# Patient Record
Sex: Male | Born: 1990 | Race: Black or African American | Hispanic: No | Marital: Single | Smoking: Never smoker
Health system: Southern US, Community
[De-identification: ages and names within clinical notes are randomized; demographics above are authoritative.]

## PROBLEM LIST (undated history)

## (undated) DIAGNOSIS — M109 Gout, unspecified: Secondary | ICD-10-CM

## (undated) DIAGNOSIS — T7840XA Allergy, unspecified, initial encounter: Secondary | ICD-10-CM

## (undated) HISTORY — DX: Gout, unspecified: M10.9

## (undated) HISTORY — DX: Allergy, unspecified, initial encounter: T78.40XA

---

## 2011-07-08 ENCOUNTER — Encounter (HOSPITAL_COMMUNITY): Payer: Self-pay | Admitting: Anesthesiology

## 2011-07-08 ENCOUNTER — Emergency Department (HOSPITAL_COMMUNITY): Payer: BC Managed Care – PPO

## 2011-07-08 ENCOUNTER — Inpatient Hospital Stay (HOSPITAL_COMMUNITY)
Admission: EM | Admit: 2011-07-08 | Discharge: 2011-07-11 | DRG: 883 | Disposition: A | Discharge status: Home / Self Care | Payer: BC Managed Care – PPO | Attending: Surgery | Admitting: Surgery

## 2011-07-08 ENCOUNTER — Encounter (HOSPITAL_COMMUNITY)
Admission: EM | Disposition: A | Discharge status: Home / Self Care | Payer: Self-pay | Source: Home / Self Care | Attending: Surgery

## 2011-07-08 ENCOUNTER — Ambulatory Visit (HOSPITAL_COMMUNITY): Payer: BC Managed Care – PPO | Admitting: Anesthesiology

## 2011-07-08 ENCOUNTER — Other Ambulatory Visit: Payer: Self-pay

## 2011-07-08 ENCOUNTER — Encounter (HOSPITAL_COMMUNITY): Payer: Self-pay | Admitting: Emergency Medicine

## 2011-07-08 DIAGNOSIS — K3532 Acute appendicitis with perforation and localized peritonitis, without abscess: Secondary | ICD-10-CM

## 2011-07-08 DIAGNOSIS — K352 Acute appendicitis with generalized peritonitis, without abscess: Principal | ICD-10-CM | POA: Diagnosis present

## 2011-07-08 DIAGNOSIS — E876 Hypokalemia: Secondary | ICD-10-CM | POA: Diagnosis present

## 2011-07-08 DIAGNOSIS — K35209 Acute appendicitis with generalized peritonitis, without abscess, unspecified as to perforation: Principal | ICD-10-CM | POA: Diagnosis present

## 2011-07-08 HISTORY — PX: LAPAROSCOPIC APPENDECTOMY: SHX408

## 2011-07-08 LAB — URINE MICROSCOPIC-ADD ON

## 2011-07-08 LAB — BASIC METABOLIC PANEL
BUN: 9 mg/dL (ref 6–23)
Chloride: 85 mEq/L — ABNORMAL LOW (ref 96–112)
GFR calc Af Amer: >90 mL/min (ref >90)
GFR calc non Af Amer: >90 mL/min (ref >90)
Potassium: 2.8 mEq/L — ABNORMAL LOW (ref 3.5–5.1)
Sodium: 126 mEq/L — ABNORMAL LOW (ref 135–145)

## 2011-07-08 LAB — URINALYSIS, ROUTINE W REFLEX MICROSCOPIC
Bilirubin Urine: NEGATIVE
Leukocytes, UA: NEGATIVE
Nitrite: NEGATIVE
Specific Gravity, Urine: 1.018 (ref 1.005–1.030)
Urobilinogen, UA: 0.2 mg/dL (ref 0.0–1.0)
pH: 6 (ref 5.0–8.0)

## 2011-07-08 LAB — CBC
Hemoglobin: 13.9 g/dL (ref 13.0–17.0)
MCH: 28.4 pg (ref 26.0–34.0)
Platelets: 180 10*3/uL (ref 150–400)
RBC: 4.89 MIL/uL (ref 4.22–5.81)
WBC: 16.2 10*3/uL — ABNORMAL HIGH (ref 4.0–10.5)

## 2011-07-08 LAB — DIFFERENTIAL
Lymphocytes Relative: 8 % — ABNORMAL LOW (ref 12–46)
Lymphs Abs: 1.3 10*3/uL (ref 0.7–4.0)
Monocytes Relative: 10 % (ref 3–12)
Neutro Abs: 13.3 10*3/uL — ABNORMAL HIGH (ref 1.7–7.7)
Neutrophils Relative %: 82 % — ABNORMAL HIGH (ref 43–77)

## 2011-07-08 SURGERY — APPENDECTOMY, LAPAROSCOPIC
Anesthesia: General | Site: Abdomen | Wound class: Dirty or Infected

## 2011-07-08 MED ORDER — ACETAMINOPHEN 10 MG/ML IV SOLN
INTRAVENOUS | Status: DC | PRN
  Administered 2011-07-08: 1000 mg via INTRAVENOUS

## 2011-07-08 MED ORDER — LIDOCAINE HCL (CARDIAC) 20 MG/ML IV SOLN
INTRAVENOUS | Status: DC | PRN
  Administered 2011-07-08: 100 mg via INTRAVENOUS

## 2011-07-08 MED ORDER — FENTANYL CITRATE 0.05 MG/ML IJ SOLN
INTRAMUSCULAR | Status: DC | PRN
  Administered 2011-07-08: 50 ug via INTRAVENOUS
  Administered 2011-07-08 (×2): 100 ug via INTRAVENOUS

## 2011-07-08 MED ORDER — BUPIVACAINE HCL (PF) 0.25 % IJ SOLN
INTRAMUSCULAR | Status: AC
  Filled 2011-07-08: qty 30

## 2011-07-08 MED ORDER — PROPOFOL 10 MG/ML IV BOLUS
INTRAVENOUS | Status: DC | PRN
  Administered 2011-07-08: 200 mg via INTRAVENOUS

## 2011-07-08 MED ORDER — SODIUM CHLORIDE 0.9 % IV SOLN
3.0000 g | Freq: Once | INTRAVENOUS | Status: AC
  Administered 2011-07-08: 3 g via INTRAVENOUS
  Filled 2011-07-08: qty 3

## 2011-07-08 MED ORDER — ONDANSETRON HCL 4 MG/2ML IJ SOLN
INTRAMUSCULAR | Status: DC | PRN
  Administered 2011-07-08: 4 mg via INTRAVENOUS

## 2011-07-08 MED ORDER — ACETAMINOPHEN 325 MG PO TABS
ORAL_TABLET | ORAL | Status: AC
  Filled 2011-07-08: qty 2

## 2011-07-08 MED ORDER — SODIUM CHLORIDE 0.9 % IV SOLN
INTRAVENOUS | Status: DC
  Administered 2011-07-08: 17:00:00 via INTRAVENOUS

## 2011-07-08 MED ORDER — KCL IN DEXTROSE-NACL 20-5-0.45 MEQ/L-%-% IV SOLN
INTRAVENOUS | Status: DC
  Administered 2011-07-08: 200 mL via INTRAVENOUS
  Filled 2011-07-08 (×5): qty 1000

## 2011-07-08 MED ORDER — MORPHINE SULFATE 4 MG/ML IJ SOLN
4.0000 mg | Freq: Once | INTRAMUSCULAR | Status: AC
  Administered 2011-07-08: 4 mg via INTRAVENOUS
  Filled 2011-07-08: qty 1

## 2011-07-08 MED ORDER — SODIUM CHLORIDE 0.9 % IV BOLUS (SEPSIS)
1000.0000 mL | Freq: Once | INTRAVENOUS | Status: AC
  Administered 2011-07-08: 1000 mL via INTRAVENOUS

## 2011-07-08 MED ORDER — SUCCINYLCHOLINE CHLORIDE 20 MG/ML IJ SOLN
INTRAMUSCULAR | Status: DC | PRN
  Administered 2011-07-08: 100 mg via INTRAVENOUS

## 2011-07-08 MED ORDER — LACTATED RINGERS IV SOLN
INTRAVENOUS | Status: DC | PRN
  Administered 2011-07-08 (×2): via INTRAVENOUS

## 2011-07-08 MED ORDER — MORPHINE SULFATE 2 MG/ML IJ SOLN
1.0000 mg | INTRAMUSCULAR | Status: DC | PRN
  Administered 2011-07-08: 2 mg via INTRAVENOUS
  Filled 2011-07-08: qty 1

## 2011-07-08 MED ORDER — ACETAMINOPHEN 325 MG PO TABS
650.0000 mg | ORAL_TABLET | Freq: Once | ORAL | Status: AC
  Administered 2011-07-08: 650 mg via ORAL

## 2011-07-08 MED ORDER — ONDANSETRON HCL 4 MG/2ML IJ SOLN
4.0000 mg | Freq: Once | INTRAMUSCULAR | Status: AC
  Administered 2011-07-08: 4 mg via INTRAVENOUS
  Filled 2011-07-08: qty 2

## 2011-07-08 MED ORDER — ROCURONIUM BROMIDE 100 MG/10ML IV SOLN
INTRAVENOUS | Status: DC | PRN
  Administered 2011-07-08: 5 mg via INTRAVENOUS

## 2011-07-08 MED ORDER — ACETAMINOPHEN 10 MG/ML IV SOLN
INTRAVENOUS | Status: AC
  Filled 2011-07-08: qty 100

## 2011-07-08 MED ORDER — MIDAZOLAM HCL 5 MG/5ML IJ SOLN
INTRAMUSCULAR | Status: DC | PRN
  Administered 2011-07-08: 2 mg via INTRAVENOUS

## 2011-07-08 MED ORDER — IOHEXOL 300 MG/ML  SOLN
100.0000 mL | Freq: Once | INTRAMUSCULAR | Status: AC | PRN
  Administered 2011-07-08: 100 mL via INTRAVENOUS

## 2011-07-08 MED ORDER — LACTATED RINGERS IR SOLN
Status: DC | PRN
  Administered 2011-07-08: 3000 mL

## 2011-07-08 SURGICAL SUPPLY — 39 items
APPLIER CLIP ROT 10 11.4 M/L (STAPLE)
BENZOIN TINCTURE PRP APPL 2/3 (GAUZE/BANDAGES/DRESSINGS) IMPLANT
CANISTER SUCTION 2500CC (MISCELLANEOUS) ×4 IMPLANT
CLIP APPLIE ROT 10 11.4 M/L (STAPLE) IMPLANT
CLOTH BEACON ORANGE TIMEOUT ST (SAFETY) ×2 IMPLANT
CUTTER FLEX LINEAR 45M (STAPLE) ×2 IMPLANT
DECANTER SPIKE VIAL GLASS SM (MISCELLANEOUS) IMPLANT
DERMABOND ADVANCED (GAUZE/BANDAGES/DRESSINGS) ×1
DERMABOND ADVANCED .7 DNX12 (GAUZE/BANDAGES/DRESSINGS) ×1 IMPLANT
DRAPE LAPAROSCOPIC ABDOMINAL (DRAPES) ×2 IMPLANT
DRAPE UTILITY 15X26 (DRAPE) ×2 IMPLANT
ELECT REM PT RETURN 9FT ADLT (ELECTROSURGICAL) ×2
ELECTRODE REM PT RTRN 9FT ADLT (ELECTROSURGICAL) ×1 IMPLANT
ENDOLOOP SUT PDS II  0 18 (SUTURE)
ENDOLOOP SUT PDS II 0 18 (SUTURE) IMPLANT
GLOVE BIOGEL PI IND STRL 7.0 (GLOVE) ×2 IMPLANT
GLOVE BIOGEL PI INDICATOR 7.0 (GLOVE) ×2
GLOVE SURG SIGNA 7.5 PF LTX (GLOVE) ×4 IMPLANT
GOWN STRL NON-REIN LRG LVL3 (GOWN DISPOSABLE) IMPLANT
GOWN STRL REIN XL XLG (GOWN DISPOSABLE) ×4 IMPLANT
KIT BASIN OR (CUSTOM PROCEDURE TRAY) ×2 IMPLANT
NS IRRIG 1000ML POUR BTL (IV SOLUTION) ×8 IMPLANT
PENCIL BUTTON HOLSTER BLD 10FT (ELECTRODE) IMPLANT
POUCH SPECIMEN RETRIEVAL 10MM (ENDOMECHANICALS) ×2 IMPLANT
RELOAD 45 VASCULAR/THIN (ENDOMECHANICALS) IMPLANT
RELOAD STAPLE TA45 3.5 REG BLU (ENDOMECHANICALS) ×2 IMPLANT
SCALPEL HARMONIC ACE (MISCELLANEOUS) ×2 IMPLANT
SET IRRIG TUBING LAPAROSCOPIC (IRRIGATION / IRRIGATOR) ×2 IMPLANT
SOLUTION ANTI FOG 6CC (MISCELLANEOUS) ×2 IMPLANT
STRIP CLOSURE SKIN 1/4X3 (GAUZE/BANDAGES/DRESSINGS) IMPLANT
SUT MON AB 5-0 PS2 18 (SUTURE) ×2 IMPLANT
SUT VICRYL 0 UR6 27IN ABS (SUTURE) ×2 IMPLANT
TOWEL OR 17X26 10 PK STRL BLUE (TOWEL DISPOSABLE) ×2 IMPLANT
TRAY FOLEY CATH 14FRSI W/METER (CATHETERS) ×2 IMPLANT
TRAY LAP CHOLE (CUSTOM PROCEDURE TRAY) ×2 IMPLANT
TROCAR XCEL BLUNT TIP 100MML (ENDOMECHANICALS) ×2 IMPLANT
TROCAR Z-THREAD FIOS 11X100 BL (TROCAR) ×2 IMPLANT
TROCAR Z-THREAD FIOS 5X100MM (TROCAR) ×2 IMPLANT
TUBING INSUFFLATION 10FT LAP (TUBING) ×2 IMPLANT

## 2011-07-08 NOTE — ED Notes (Signed)
Pt c/o lower abd pain x 1 week, locates pain just left of center.  Pt also c/o a few episodes of diarrhea and vomiting.  Last emesis 3 days ago. Pt was seen at Urgent Care and was sent here for r/o appedencitis.

## 2011-07-08 NOTE — ED Provider Notes (Signed)
History     CSN: [id number]  Arrival date & time 07/08/11  1407   First MD Initiated Contact with Patient 07/08/11 1519      Chief Complaint  Patient presents with  . Abdominal Pain    (Consider location/radiation/quality/duration/timing/severity/associated sxs/prior treatment) HPI Jorge Nicholson is a 21 y.o. male who has had abdominal pain, and diarrhea for 5 days. At the beginning of the illness. He had 2 days worth of vomiting. The diarrhea is very loose to watery and brown in color. He's had a fever for several days despite taking Tylenol for it. He saw a primary care doctor at an urgent care and was directed here for further evaluation. He does not have cough, shortness of breath, chest pain, back pain, trouble urinating. He has not had problems similar to this in the past. He has not tried any other medication. He has no sick contacts.     History reviewed. No pertinent past medical history.  History reviewed. No pertinent past surgical history.  History reviewed. No pertinent family history.  History  Substance Use Topics  . Smoking status: Current Everyday Smoker -- 0.5 packs/day  . Smokeless tobacco: Not on file  . Alcohol Use: Yes     occasionally      Review of Systems  All other systems reviewed and are negative.    Allergies  Review of patient's allergies indicates no known allergies.  Home Medications   Current Outpatient Rx  Name Route Sig Dispense Refill  . ACETAMINOPHEN 500 MG PO TABS Oral Take 1,000 mg by mouth every 6 (six) hours as needed. pain      BP 124/71  Pulse 111  Temp(Src) 104.1 F (40.1 C) (Oral)  Resp 20  Ht 5\' 8"  (1.727 m)  Wt 150 lb (68.04 kg)  BMI 22.81 kg/m2  SpO2 97%  Physical Exam  Nursing note and vitals reviewed. Constitutional: He is oriented to person, place, and time. He appears well-developed and well-nourished.  HENT:  Head: Normocephalic and atraumatic.  Right Ear: External ear normal.  Left Ear: External  ear normal.  Eyes: Conjunctivae and EOM are normal. Pupils are equal, round, and reactive to light.  Neck: Normal range of motion and phonation normal. Neck supple.  Cardiovascular: Normal rate, normal heart sounds and intact distal pulses.  Exam reveals no friction rub.   No murmur heard.      Irregular heartbeat  Pulmonary/Chest: Effort normal and breath sounds normal. He exhibits no bony tenderness.  Abdominal: Soft. Normal appearance.       Mild diffuse lower quadrant tenderness with increased tenderness in the right lower quadrant. Bowel sounds are hyperactive.  Musculoskeletal: Normal range of motion.  Neurological: He is alert and oriented to person, place, and time. He has normal strength. No cranial nerve deficit or sensory deficit. He exhibits normal muscle tone. Coordination normal.  Skin: Skin is warm, dry and intact.  Psychiatric: He has a normal mood and affect. His behavior is normal. Judgment and thought content normal.    ED Course  Procedures (including critical care time)  Date: 07/08/2011  Rate: 97   Rhythm: normal sinus rhythm  QRS Axis: normal  Intervals: normal  ST/T Wave abnormalities: normal  Conduction Disutrbances:none  Narrative Interpretation:   Old EKG Reviewed: none available  Emergency department treatment: IV fluids, and Tylenol. CAT scan returned showing appendicitis. I discussed case with Dr. Ezzard Standing at [phone] hours. He wants to have the patient treated with antibiotics and he will see  the patient and proceed with an operative or suture later this evening.  Labs Reviewed  CBC - Abnormal; Notable for the following:    WBC 16.2 (*)    All other components within normal limits  DIFFERENTIAL - Abnormal; Notable for the following:    Neutrophils Relative 82 (*)    Neutro Abs 13.3 (*)    Lymphocytes Relative 8 (*)    Monocytes Absolute 1.5 (*)    All other components within normal limits  BASIC METABOLIC PANEL - Abnormal; Notable for the following:     Sodium 126 (*)    Potassium 2.8 (*)    Chloride 85 (*)    Glucose, Bld 106 (*)    All other components within normal limits  URINALYSIS, ROUTINE W REFLEX MICROSCOPIC - Abnormal; Notable for the following:    Color, Urine AMBER (*) BIOCHEMICALS MAY BE AFFECTED BY COLOR   APPearance CLOUDY (*)    Hgb urine dipstick SMALL (*)    Ketones, ur TRACE (*)    Protein, ur 100 (*)    All other components within normal limits  LIPASE, BLOOD  URINE MICROSCOPIC-ADD ON  URINE CULTURE   Ct Abdomen Pelvis W Contrast  07/08/2011  *RADIOLOGY REPORT*  Clinical Data: 21 year old male with abdominal and pelvic pain.  CT ABDOMEN AND PELVIS WITH CONTRAST  Technique:  Multidetector CT imaging of the abdomen and pelvis was performed following the standard protocol during bolus administration of intravenous contrast.  Contrast: OMNIPAQUE IOHEXOL 300 MG/ML IV SOLN  Comparison: None  Findings: The appendix is thickened with a moderate to large amount of inflammation within the right pelvis compatible with appendicitis.  A 2 cm focal area of low attenuation with two small foci of gas (image 59) may represent a dilated appendiceal tip but more suspicious for appendiceal rupture and phlegmon. Enlarged right mesenteric lymph nodes are identified and likely reactive.  There is no evidence of free fluid, biliary dilatation or abdominal aortic aneurysm.  The liver, spleen, kidneys, pancreas and gallbladder are unremarkable.  No other bowel abnormalities are identified.  There is no evidence of bowel obstruction. The bladder is within normal limits. No acute or suspicious bony abnormalities are identified.  IMPRESSION: Appendicitis with findings likely representing rupture and phlegmon.  These results were called to Dr. Effie Shy on 07/08/2011 at 6:15 p.m.  Original Report Authenticated By: Rosendo Gros, M.D.     1. Acute appendicitis with rupture       MDM  Appendix complicated by rupture. Patient symptomatically stable.  Doubt bacteremia or sepsis        Flint Melter, MD 07/08/11 [phone]

## 2011-07-08 NOTE — H&P (Signed)
Re:   Jorge Nicholson DOB:   1990-11-15 MRN:   [id number]  ASSESSMENT AND PLAN: 1.  Appendicitis, ? Ruptured.  I discussed with the patient the indications and risks of appendiceal surgery.  The primary risks of appendiceal surgery include, but are not limited to, bleeding, infection, bowel surgery, and open surgery.  There is also the risk that the patient may have continued symptoms after surgery.  However, the likelihood of improvement in symptoms and return to the patient's normal status is good. We discussed the typical post-operative recovery course. I tried to answer the patient's questions.  His family was in the room during the discussion.  Chief Complaint  Patient presents with  . Abdominal Pain   REFERRING PHYSICIAN:  Dr. Bernell List, [facility]  HISTORY OF PRESENT ILLNESS: Jorge Nicholson is a 20 y.o. (DOB: [birth year])  AA male whose primary care physician is No primary provider on file. and comes to the [facility] for abdominal pain. He actually first went to Urgent Care on [address] and they sent him to the [facility] for further evaluation.  He developed abdominal pain on Friday, February 15.  He thought he was feeling better.  He was also in [place].  He came back to [facility]. Yesterday, had continued abdominal pain, and that is when he sought medical care.  He has had some nausea and vomiting.  He has not prior GI history.  He has been found to have appendicitis.  I was called by Dr. Bernell List.   History reviewed. No pertinent past medical history.   History reviewed. No pertinent past surgical history.    Current Facility-Administered Medications  Medication Dose Route Frequency Provider Last Rate Last Dose  . 0.9 %  sodium chloride infusion   Intravenous Continuous Flint Melter, MD 125 mL/hr at 07/08/11 1645    . acetaminophen (TYLENOL) tablet 650 mg  650 mg Oral Once Flint Melter, MD   650 mg at 07/08/11 1652  . Ampicillin-Sulbactam (UNASYN) 3 g in sodium chloride 0.9 % 100 mL IVPB  3 g  Intravenous Once Flint Melter, MD   3 g at 07/08/11 1846  . iohexol (OMNIPAQUE) 300 MG/ML solution 100 mL  100 mL Intravenous Once PRN Medication Radiologist, MD   100 mL at 07/08/11 1757  . morphine 4 MG/ML injection 4 mg  4 mg Intravenous Once Flint Melter, MD   4 mg at 07/08/11 1609  . ondansetron (ZOFRAN) injection 4 mg  4 mg Intravenous Once Flint Melter, MD   4 mg at 07/08/11 1609  . sodium chloride 0.9 % bolus 1,000 mL  1,000 mL Intravenous Once Flint Melter, MD   1,000 mL at 07/08/11 1610     No Known Allergies  REVIEW OF SYSTEMS: Skin:  No history of rash.  No history of abnormal moles. Infection:  No history of hepatitis or HIV.  No history of MRSA. Neurologic:  No history of stroke.  No history of seizure.  No history of headaches. Cardiac:  No history of hypertension. No history of heart disease.  No history of prior cardiac catheterization.  No history of seeing a cardiologist. Pulmonary:  Does not smoke cigarettes.  No asthma or bronchitis.  No OSA/CPAP.  Endocrine:  No diabetes. No thyroid disease. Gastrointestinal:  No history of stomach disease.  No history of liver disease.  No history of gall bladder disease.  No history of pancreas disease.  No history of colon disease. Urologic:  No history of kidney stones.  No history of bladder infections. Musculoskeletal:  No history of joint or back disease. Hematologic:  No bleeding disorder.  No history of anemia.  Not anticoagulated. Psycho-social:  The patient is oriented.   The patient has no obvious psychologic or social impairment to understanding our conversation and plan.  SOCIAL and FAMILY HISTORY: His girlfriend, parents, and grand mother are in the room. 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  PHYSICAL EXAM: BP 115/62  Pulse 62  Temp(Src) 102.9 F (39.4 C) (Oral)  Resp 16  Ht 5\' 8"  (1.727 m)  Wt 150 lb (68.04 kg)  BMI 22.81 kg/m2  SpO2 96%  General: WN AA M who is alert and generally  healthy appearing.  HEENT: Normal. Pupils equal. Good dentition. Neck: Supple. No mass.  No thyroid mass.  Carotid pulse okay with no bruit. Lymph Nodes:  No supraclavicular or cervical nodes. Lungs: Clear to auscultation and symmetric breath sounds. Heart:  RRR. No murmur or rub.  Abdomen: Soft. No mass. He has tenderness with mild guarding in the RLQ.  He has had some pain meds.. No hernia. His bowel sounds are decreased.  No abdominal scars. Rectal: Not done. Extremities:  Good strength and ROM  in upper and lower extremities. Neurologic:  Grossly intact to motor and sensory function. Psychiatric: Has normal mood and affect. Behavior is normal.   DATA REVIEWED: WBC - 16,200. CT scan - appendicitis, possible rupture/phlegmon.  Ovidio Kin, MD,  [facility] [address] [place]  [address]   [place], [place]    [address] Phone:  [phone] FAX:  [phone]

## 2011-07-08 NOTE — Anesthesia Preprocedure Evaluation (Signed)
Anesthesia Evaluation  Patient identified by MRN, date of birth, ID band Patient awake    Reviewed: Allergy & Precautions, H&P , NPO status , Patient's Chart, lab work & pertinent test results  Airway Mallampati: II TM Distance: >3 FB Neck ROM: full    Dental No notable dental hx.    Pulmonary neg pulmonary ROS,  clear to auscultation  Pulmonary exam normal       Cardiovascular Exercise Tolerance: Good neg cardio ROS regular Normal    Neuro/Psych Negative Neurological ROS  Negative Psych ROS   GI/Hepatic negative GI ROS, Neg liver ROS,   Endo/Other  Negative Endocrine ROS  Renal/GU negative Renal ROS  Genitourinary negative   Musculoskeletal   Abdominal   Peds  Hematology negative hematology ROS (+)   Anesthesia Other Findings   Reproductive/Obstetrics negative OB ROS                           Anesthesia Physical Anesthesia Plan  ASA: I  Anesthesia Plan: General   Post-op Pain Management:    Induction:   Airway Management Planned:   Additional Equipment:   Intra-op Plan:   Post-operative Plan:   Informed Consent: I have reviewed the patients History and Physical, chart, labs and discussed the procedure including the risks, benefits and alternatives for the proposed anesthesia with the patient or authorized representative who has indicated his/her understanding and acceptance.   Dental Advisory Given  Plan Discussed with: CRNA  Anesthesia Plan Comments:         Anesthesia Quick Evaluation  

## 2011-07-09 ENCOUNTER — Other Ambulatory Visit (INDEPENDENT_AMBULATORY_CARE_PROVIDER_SITE_OTHER): Payer: Self-pay | Admitting: Surgery

## 2011-07-09 LAB — BASIC METABOLIC PANEL
CO2: 31 mEq/L (ref 19–32)
Chloride: 90 mEq/L — ABNORMAL LOW (ref 96–112)
Creatinine, Ser: 0.99 mg/dL (ref 0.50–1.35)
Glucose, Bld: 114 mg/dL — ABNORMAL HIGH (ref 70–99)

## 2011-07-09 LAB — DIFFERENTIAL
Basophils Relative: 0 % (ref 0–1)
Eosinophils Relative: 0 % (ref 0–5)
Lymphs Abs: 1.5 10*3/uL (ref 0.7–4.0)
Monocytes Relative: 5 % (ref 3–12)
Neutro Abs: 13.9 10*3/uL — ABNORMAL HIGH (ref 1.7–7.7)
WBC Morphology: INCREASED

## 2011-07-09 LAB — CBC
HCT: 35.9 % — ABNORMAL LOW (ref 39.0–52.0)
Hemoglobin: 12.6 g/dL — ABNORMAL LOW (ref 13.0–17.0)
MCH: 28.8 pg (ref 26.0–34.0)
MCV: 82 fL (ref 78.0–100.0)
Platelets: 168 10*3/uL (ref 150–400)
RBC: 4.38 MIL/uL (ref 4.22–5.81)
WBC: 16.2 10*3/uL — ABNORMAL HIGH (ref 4.0–10.5)

## 2011-07-09 LAB — SURGICAL PCR SCREEN
MRSA, PCR: NEGATIVE
Staphylococcus aureus: NEGATIVE

## 2011-07-09 MED ORDER — HYDROMORPHONE HCL PF 1 MG/ML IJ SOLN
0.2500 mg | INTRAMUSCULAR | Status: DC | PRN
  Administered 2011-07-09: 0.5 mg via INTRAVENOUS

## 2011-07-09 MED ORDER — MORPHINE SULFATE 2 MG/ML IJ SOLN
1.0000 mg | INTRAMUSCULAR | Status: DC | PRN
  Administered 2011-07-09 (×3): 2 mg via INTRAVENOUS
  Filled 2011-07-09 (×3): qty 1

## 2011-07-09 MED ORDER — BUPIVACAINE HCL (PF) 0.25 % IJ SOLN
INTRAMUSCULAR | Status: DC | PRN
  Administered 2011-07-09: 23 mL

## 2011-07-09 MED ORDER — HYDROCODONE-ACETAMINOPHEN 5-325 MG PO TABS
1.0000 | ORAL_TABLET | ORAL | Status: DC | PRN
  Administered 2011-07-09 – 2011-07-10 (×4): 2 via ORAL
  Administered 2011-07-10: 1 via ORAL
  Administered 2011-07-10 – 2011-07-11 (×3): 2 via ORAL
  Filled 2011-07-09 (×8): qty 2

## 2011-07-09 MED ORDER — KCL IN DEXTROSE-NACL 20-5-0.9 MEQ/L-%-% IV SOLN
INTRAVENOUS | Status: DC
  Administered 2011-07-09 – 2011-07-10 (×3): via INTRAVENOUS
  Filled 2011-07-09 (×6): qty 1000

## 2011-07-09 MED ORDER — NEOSTIGMINE METHYLSULFATE 1 MG/ML IJ SOLN
INTRAMUSCULAR | Status: DC | PRN
  Administered 2011-07-09: 4 mg via INTRAVENOUS

## 2011-07-09 MED ORDER — LACTATED RINGERS IV SOLN: INTRAVENOUS | Status: DC

## 2011-07-09 MED ORDER — PIPERACILLIN-TAZOBACTAM 3.375 G IVPB
3.3750 g | Freq: Three times a day (TID) | INTRAVENOUS | Status: DC
  Administered 2011-07-09 – 2011-07-11 (×8): 3.375 g via INTRAVENOUS
  Filled 2011-07-09 (×12): qty 50

## 2011-07-09 MED ORDER — PROMETHAZINE HCL 25 MG/ML IJ SOLN: 6.2500 mg | INTRAMUSCULAR | Status: DC | PRN

## 2011-07-09 MED ORDER — KCL IN DEXTROSE-NACL 20-5-0.45 MEQ/L-%-% IV SOLN
INTRAVENOUS | Status: DC
  Administered 2011-07-09: 150 mL/h via INTRAVENOUS
  Filled 2011-07-09 (×3): qty 1000

## 2011-07-09 MED ORDER — ONDANSETRON HCL 4 MG/2ML IJ SOLN
4.0000 mg | Freq: Four times a day (QID) | INTRAMUSCULAR | Status: DC | PRN
  Administered 2011-07-10: 4 mg via INTRAVENOUS
  Filled 2011-07-09: qty 2

## 2011-07-09 MED ORDER — GLYCOPYRROLATE 0.2 MG/ML IJ SOLN
INTRAMUSCULAR | Status: DC | PRN
  Administered 2011-07-09: .6 mg via INTRAVENOUS

## 2011-07-09 MED ORDER — HYDROMORPHONE HCL PF 1 MG/ML IJ SOLN
INTRAMUSCULAR | Status: AC
  Filled 2011-07-09: qty 1

## 2011-07-09 MED ORDER — HEPARIN SODIUM (PORCINE) 5000 UNIT/ML IJ SOLN
5000.0000 [IU] | Freq: Three times a day (TID) | INTRAMUSCULAR | Status: DC
  Administered 2011-07-09 – 2011-07-11 (×7): 5000 [IU] via SUBCUTANEOUS
  Filled 2011-07-09 (×10): qty 1

## 2011-07-09 MED ORDER — ONDANSETRON HCL 4 MG PO TABS: 4.0000 mg | ORAL_TABLET | Freq: Four times a day (QID) | ORAL | Status: DC | PRN

## 2011-07-09 MED ORDER — MEPERIDINE HCL 25 MG/ML IJ SOLN: 6.2500 mg | INTRAMUSCULAR | Status: DC | PRN

## 2011-07-09 NOTE — Transfer of Care (Signed)
Immediate Anesthesia Transfer of Care Note  Patient: Jorge Nicholson  Procedure(s) Performed: Procedure(s) (LRB): APPENDECTOMY LAPAROSCOPIC (N/A)  Patient Location: PACU  Anesthesia Type: General  Level of Consciousness: awake, alert  and oriented  Airway & Oxygen Therapy: Patient Spontanous Breathing and Patient connected to face mask oxygen  Post-op Assessment: Report given to PACU RN and Post -op Vital signs reviewed and stable  Post vital signs: Reviewed and stable  Complications: No apparent anesthesia complications

## 2011-07-09 NOTE — Progress Notes (Signed)
Report to minnie rn,  dsg's x 3 cdi

## 2011-07-09 NOTE — Op Note (Signed)
Jorge Nicholson, Jorge Nicholson              ACCOUNT NO.:  [id number]  MEDICAL RECORD NO.:  [id number]  LOCATION:  1539                         FACILITY:  [facility]  PHYSICIAN:  Sandria Bales. Ezzard Standing, M.D.  DATE OF BIRTH:  17-Mar-1991  DATE OF PROCEDURE:  07/09/2011                              OPERATIVE REPORT  PREOPERATIVE DIAGNOSIS:  Appendicitis.  POSTOPERATIVE DIAGNOSIS:  Ruptured hemorrhagic appendicitis.  PROCEDURE:  Laparoscopic appendectomy.  SURGEON:  Sandria Bales. Ezzard Standing, M.D.  ANESTHESIA:  General endotracheal.  ESTIMATED BLOOD LOSS:  Less than 50 cc.  INDICATION FOR PROCEDURE:  Jorge Nicholson is a 21 year old African American male who presented to the [facility] Emergency Room with the signs and symptoms of appendicitis.  He now comes for attempted laparoscopic appendectomy.  The indications and potential complications of the procedure was explained to the patient.  Potential complications include, but are not limited to bleeding, open surgery, another diagnosis, and residual infection.  OPERATIVE NOTE:  The patient had his left arm tucked, his right arm out to the side, a Foley catheter in place.  He will be given Unasyn as an antibiotic, area of room #1 with Dr. Phillips Grout, the supervising anesthesiologist.  A time-out was held with surgical checklist run.  His abdomen was prepped with ChloraPrep and sterilely draped.  I went through an infraumbilical incision with sharp dissection carried down the abdominal cavity.  I placed a 5-mm trocar in the right upper quadrant and a 11-mm trocar in the left lower quadrant.  Abdominal exploration revealed the right and left lobes of liver unremarkable, the gallbladder unremarkable, stomach unremarkable.    His cecum was stretched over the pelvic brim and into the pelvis. His appendix was stuck on the inner side of the right pelvis, was hemorrhagic looking, and focally ruptured.  I could see the small bowel struck on this, which was rolled  medially.  The appendix was both focally ruptured and hemorrhagic.  I was able to free up the tip of the appendix, take the mesentery of the appendix down with a Harmonic Scalpel.  I got to the base of the appendix where I used a blue load of 45-mm Ethicon GIA stapler across the base.  I divided the appendix, placed it in an EndoCatch bag, and delivered through the umbilicus.  I then irrigated the pelvis and area around the appendiceal stump with about 4 L of saline.  I think I got most of the debris out.  I saw no active bleeding.  The trocars were then removed in turn.  The umbilical port closed with 0 Vicryl suture.  The skin of the left lower quadrant also closed with a 0 Vicryl suture.  The skin at each site closed with a 5-0 Monocryl suture and painted with Dermabond.    The patient tolerated the procedure well and was transported to the recovery room in good condition.  Sponge and needle count were correct at the end of the case.   Sandria Bales. Ezzard Standing, M.D., FACS   DHN/MEDQ  D:  07/09/2011  T:  07/09/2011  Job:  [id number]

## 2011-07-09 NOTE — Progress Notes (Signed)
Patient ID: Treyvion Durkee, male   DOB: Sep 24, 1990, 21 y.o.   MRN: [id number] 1 Day Post-Op  Subjective: Pain is controlled with meds. Has been up to bathroom. Denies nausea or other complaints.  Objective: Vital signs in last 24 hours: Temp:  [99 F (37.2 C)-104.1 F (40.1 C)] 100.9 F (38.3 C) (02/23 0600) Pulse Rate:  [62-115] 104  (02/23 0600) Resp:  [16-23] 18  (02/23 0600) BP: (110-134)/(62-78) 124/78 mmHg (02/23 0600) SpO2:  [96 %-100 %] 99 % (02/23 0600) Weight:  [150 lb (68.04 kg)-162 lb 4.8 oz (73.619 kg)] 162 lb 4.8 oz (73.619 kg) (02/22 2013) Last BM Date: 07/08/11  Intake/Output from previous day: 02/22 0701 - 02/23 0700 In: 2560 [P.O.:960; I.V.:1600] Out: 1000 [Urine:900; Blood:100] Intake/Output this shift:    General appearance: alert and no distress GI: abnormal findings:  moderate tenderness in the RLQ Incision/Wound:clean and dry  Lab Results:   Basename 07/08/11 1530  WBC 16.2*  HGB 13.9  HCT 39.6  PLT 180   BMET  Basename 07/08/11 1530  NA 126*  K 2.8*  CL 85*  CO2 27  GLUCOSE 106*  BUN 9  CREATININE 0.97  CALCIUM 9.1     Studies/Results: Ct Abdomen Pelvis W Contrast  07/08/2011  *RADIOLOGY REPORT*  Clinical Data: 21 year old male with abdominal and pelvic pain.  CT ABDOMEN AND PELVIS WITH CONTRAST  Technique:  Multidetector CT imaging of the abdomen and pelvis was performed following the standard protocol during bolus administration of intravenous contrast.  Contrast: OMNIPAQUE IOHEXOL 300 MG/ML IV SOLN  Comparison: None  Findings: The appendix is thickened with a moderate to large amount of inflammation within the right pelvis compatible with appendicitis.  A 2 cm focal area of low attenuation with two small foci of gas (image 59) may represent a dilated appendiceal tip but more suspicious for appendiceal rupture and phlegmon. Enlarged right mesenteric lymph nodes are identified and likely reactive.  There is no evidence of free fluid,  biliary dilatation or abdominal aortic aneurysm.  The liver, spleen, kidneys, pancreas and gallbladder are unremarkable.  No other bowel abnormalities are identified.  There is no evidence of bowel obstruction. The bladder is within normal limits. No acute or suspicious bony abnormalities are identified.  IMPRESSION: Appendicitis with findings likely representing rupture and phlegmon.  These results were called to Dr. Effie Shy on 07/08/2011 at 6:15 p.m.  Original Report Authenticated By: Rosendo Gros, M.D.    Anti-infectives: Anti-infectives     Start     Dose/Rate Route Frequency Ordered Stop   07/09/11 0100  piperacillin-tazobactam (ZOSYN) IVPB 3.375 g       3.375 g 12.5 mL/hr over 240 Minutes Intravenous Every 8 hours 07/09/11 0050     07/08/11 1845   Ampicillin-Sulbactam (UNASYN) 3 g in sodium chloride 0.9 % 100 mL IVPB        3 g 100 mL/hr over 60 Minutes Intravenous  Once 07/08/11 1836 07/08/11 1946          Assessment/Plan: s/p Procedure(s): APPENDECTOMY LAPAROSCOPIC Stable postoperatively. Continue IV antibiotics. Hypokalemia preop. Will recheck electrolytes. Start clear liquid diet.   LOS: 1 day    Latissa Frick T 07/09/2011

## 2011-07-09 NOTE — Brief Op Note (Signed)
07/08/2011 - 07/09/2011  12:26 AM  PATIENT:  Jorge Nicholson, 20 y.o., male, MRN: [id number]  PREOP DIAGNOSIS:  appendicitis  POSTOP DIAGNOSIS:   Ruptured, hemorrhagic appendicitis.  PROCEDURE:   Procedure(s): APPENDECTOMY LAPAROSCOPIC  SURGEON:   Ovidio Kin, M.D.  ASSISTANT:   None  ANESTHESIA:   general  Peggy Williford - CRNA Phillips Grout, MD - Anesthesiologist  General  EBL:  <50  ml  BLOOD ADMINISTERED: none  DRAINS: none   LOCAL MEDICATIONS USED:   20 cc 1/4% marcaine  SPECIMEN:   appendix  COUNTS CORRECT:  YES  INDICATIONS FOR PROCEDURE:  Jorge Nicholson is a 21 y.o. (DOB: 12-04-1990) AA male whose primary care physician is No primary provider on file. and comes for appendectomy   The indications and risks of the surgery were explained to the patient.  The risks include, but are not limited to, infection, bleeding, and nerve injury.  Note dictated to:   #[id number]

## 2011-07-10 LAB — CBC
HCT: 36.1 % — ABNORMAL LOW (ref 39.0–52.0)
MCH: 28.4 pg (ref 26.0–34.0)
MCV: 82.6 fL (ref 78.0–100.0)
RBC: 4.37 MIL/uL (ref 4.22–5.81)
RDW: 13.6 % (ref 11.5–15.5)
WBC: 14.1 10*3/uL — ABNORMAL HIGH (ref 4.0–10.5)

## 2011-07-10 LAB — URINE CULTURE: Culture  Setup Time: 201302230117

## 2011-07-10 LAB — BASIC METABOLIC PANEL
BUN: 6 mg/dL (ref 6–23)
CO2: 32 mEq/L (ref 19–32)
Chloride: 96 mEq/L (ref 96–112)
Creatinine, Ser: 0.82 mg/dL (ref 0.50–1.35)

## 2011-07-10 MED ORDER — POTASSIUM CHLORIDE 20 MEQ PO PACK: 20.0000 meq | PACK | Freq: Two times a day (BID) | ORAL | Status: DC

## 2011-07-10 MED ORDER — POTASSIUM CHLORIDE CRYS ER 20 MEQ PO TBCR
20.0000 meq | EXTENDED_RELEASE_TABLET | Freq: Two times a day (BID) | ORAL | Status: DC
  Administered 2011-07-10 – 2011-07-11 (×3): 20 meq via ORAL
  Filled 2011-07-10 (×4): qty 1

## 2011-07-10 NOTE — Progress Notes (Signed)
Patient ID: Jorge Nicholson, male   DOB: 09-24-1990, 21 y.o.   MRN: [id number] 2 Days Post-Op  Subjective: Feels much better today. Denies pain or other complaints. Tolerating clear liquids without nausea.  Objective: Vital signs in last 24 hours: Temp:  [98.1 F (36.7 C)-99.9 F (37.7 C)] 99.9 F (37.7 C) (02/24 0557) Pulse Rate:  [74-113] 74  (02/24 0557) Resp:  [18-20] 18  (02/24 0557) BP: (116-133)/(71-83) 116/71 mmHg (02/24 0557) SpO2:  [96 %-99 %] 96 % (02/24 0557) Last BM Date: 07/08/11  Intake/Output from previous day: 02/23 0701 - 02/24 0700 In: 2560 [P.O.:360; I.V.:2000; IV Piggyback:200] Out: 2200 [Urine:2200] Intake/Output this shift:    General appearance: alert and no distress GI: normal findings: soft, non-tender Incision/Wound:clean and dry without evidence of infection  Lab Results:   [facility] 07/10/11 0456 07/09/11 0848  WBC 14.1* 16.2*  HGB 12.4* 12.6*  HCT 36.1* 35.9*  PLT 174 168   BMET  Basename 07/10/11 0456 07/09/11 1018  NA 133* 128*  K 3.2* 3.5  CL 96 90*  CO2 32 31  GLUCOSE 126* 114*  BUN 6 9  CREATININE 0.82 0.99  CALCIUM 7.9* 8.1*     Studies/Results: Ct Abdomen Pelvis W Contrast  07/08/2011  *RADIOLOGY REPORT*  Clinical Data: 21 year old male with abdominal and pelvic pain.  CT ABDOMEN AND PELVIS WITH CONTRAST  Technique:  Multidetector CT imaging of the abdomen and pelvis was performed following the standard protocol during bolus administration of intravenous contrast.  Contrast: OMNIPAQUE IOHEXOL 300 MG/ML IV SOLN  Comparison: None  Findings: The appendix is thickened with a moderate to large amount of inflammation within the right pelvis compatible with appendicitis.  A 2 cm focal area of low attenuation with two small foci of gas (image 59) may represent a dilated appendiceal tip but more suspicious for appendiceal rupture and phlegmon. Enlarged right mesenteric lymph nodes are identified and likely reactive.  There is no  evidence of free fluid, biliary dilatation or abdominal aortic aneurysm.  The liver, spleen, kidneys, pancreas and gallbladder are unremarkable.  No other bowel abnormalities are identified.  There is no evidence of bowel obstruction. The bladder is within normal limits. No acute or suspicious bony abnormalities are identified.  IMPRESSION: Appendicitis with findings likely representing rupture and phlegmon.  These results were called to Dr. Effie Shy on 07/08/2011 at 6:15 p.m.  Original Report Authenticated By: Rosendo Gros, M.D.    Anti-infectives: Anti-infectives     Start     Dose/Rate Route Frequency Ordered Stop   07/09/11 0100  piperacillin-tazobactam (ZOSYN) IVPB 3.375 g       3.375 g 12.5 mL/hr over 240 Minutes Intravenous Every 8 hours 07/09/11 0050     07/08/11 1845   Ampicillin-Sulbactam (UNASYN) 3 g in sodium chloride 0.9 % 100 mL IVPB        3 g 100 mL/hr over 60 Minutes Intravenous  Once 07/08/11 1836 07/08/11 1946          Assessment/Plan: s/p Procedure(s): APPENDECTOMY LAPAROSCOPIC Much improved today but still with low grade fever and mildly elevated white blood count. Will continue IV antibiotics today. Probably home soon on oral antibiotics. Advance diet.   LOS: 2 days    Dandra Shambaugh T 07/10/2011

## 2011-07-10 NOTE — Anesthesia Postprocedure Evaluation (Signed)
  Anesthesia Post-op Note  Patient: Jorge Nicholson  Procedure(s) Performed: Procedure(s) (LRB): APPENDECTOMY LAPAROSCOPIC (N/A)  Patient Location: PACU  Anesthesia Type: General  Level of Consciousness: awake and alert   Airway and Oxygen Therapy: Patient Spontanous Breathing  Post-op Pain: mild  Post-op Assessment: Post-op Vital signs reviewed, Patient's Cardiovascular Status Stable, Respiratory Function Stable, Patent Airway and No signs of Nausea or vomiting  Post-op Vital Signs: stable  Complications: No apparent anesthesia complications

## 2011-07-11 LAB — CBC
HCT: 36.7 % — ABNORMAL LOW (ref 39.0–52.0)
MCHC: 34.1 g/dL (ref 30.0–36.0)
MCV: 83.2 fL (ref 78.0–100.0)
RDW: 13.6 % (ref 11.5–15.5)

## 2011-07-11 LAB — BASIC METABOLIC PANEL
BUN: 6 mg/dL (ref 6–23)
Calcium: 8.1 mg/dL — ABNORMAL LOW (ref 8.4–10.5)
Creatinine, Ser: 0.89 mg/dL (ref 0.50–1.35)
GFR calc Af Amer: >90 mL/min (ref >90)
GFR calc non Af Amer: >90 mL/min (ref >90)

## 2011-07-11 MED ORDER — HYDROCODONE-ACETAMINOPHEN 5-325 MG PO TABS: 1.0000 | ORAL_TABLET | ORAL | Status: AC | PRN

## 2011-07-11 MED ORDER — AMOXICILLIN-POT CLAVULANATE 875-125 MG PO TABS: 1.0000 | ORAL_TABLET | Freq: Two times a day (BID) | ORAL | Status: AC

## 2011-07-11 NOTE — Discharge Summary (Signed)
Jorge Shaler M. Patterson Hollenbaugh, MD, FACS General, Bariatric, & Minimally Invasive Surgery [facility]  

## 2011-07-11 NOTE — Discharge Summary (Signed)
Patient ID: Kdyn Vonbehren MRN: [id number] DOB/AGE: 02/09/1991 20 y.o.  Admit date: 07/08/2011 Discharge date: 07/11/2011  Procedures:  Laparoscopic appendectomy  Consults: None  Reason for Admission:  This is a 21 yo male who presented to the [facility] with c/o RLQ abdominal pain.  He was found to have appendicitis.  Admission Diagnoses: 1. Acute appendicitis  Hospital Course: The patient was admitted.  He was taken to the OR where he was found to have hemorrhagic perforated appendicitis.  This was removed.  The patient was left on IV abx therapy for the duration of his admission.  His diet was advanced as tolerated postoperatively.  The patient was stable for discharge on POD# 2.  PE: Abd:  Soft, appropriately tender, +BS, ND, incisions c/d/i with dermabond  Discharge Diagnoses:  1. Acute hemorrhagic perforated appendicitis, s/p lap appy  Discharge Medications: Medication List  As of 07/11/2011 10:26 AM   STOP taking these medications         acetaminophen 500 MG tablet         TAKE these medications         amoxicillin-clavulanate 875-125 MG per tablet   Commonly known as: AUGMENTIN   Take 1 tablet by mouth 2 (two) times daily.      HYDROcodone-acetaminophen 5-325 MG per tablet   Commonly known as: NORCO   Take 1-2 tablets by mouth every 4 (four) hours as needed.            Discharge Instructions: Follow-up Information    Follow up with [facility] on 07/26/2011. (1:50pm, arrive at 1:30pm)    Contact information:   [facility] [address]  [address] [place] [phone]         Signed: Letha Cape 07/11/2011, 10:26 AM

## 2011-07-11 NOTE — Discharge Instructions (Signed)
[facility] ______[facility] °LAPAROSCOPIC SURGERY: POST OP INSTRUCTIONS °Always review your discharge instruction sheet given to you by the facility where your surgery was performed. °IF YOU HAVE DISABILITY OR FAMILY LEAVE FORMS, YOU MUST BRING THEM TO THE OFFICE FOR PROCESSING.   °DO NOT GIVE THEM TO YOUR DOCTOR. ° °1. A prescription for pain medication may be given to you upon discharge.  Take your pain medication as prescribed, if needed.  If narcotic pain medicine is not needed, then you may take acetaminophen (Tylenol) or ibuprofen (Advil) as needed. °2. Take your usually prescribed medications unless otherwise directed. °3. If you need a refill on your pain medication, please contact your pharmacy.  They will contact our office to request authorization. Prescriptions will not be filled after 5pm or on week-ends. °4. You should follow a light diet the first few days after arrival home, such as soup and crackers, etc.  Be sure to include lots of fluids daily. °5. Most patients will experience some swelling and bruising in the area of the incisions.  Ice packs will help.  Swelling and bruising can take several days to resolve.  °6. It is common to experience some constipation if taking pain medication after surgery.  Increasing fluid intake and taking a stool softener (such as Colace) will usually help or prevent this problem from occurring.  A mild laxative (Milk of Magnesia or Miralax) should be taken according to package instructions if there are no bowel movements after 48 hours. °7. Unless discharge instructions indicate otherwise, you may remove your bandages 24-48 hours after surgery, and you may shower at that time.  You may have steri-strips (small skin tapes) in place directly over the incision.  These strips should be left on the skin for 7-10 days.  If your surgeon used skin glue on the incision, you may shower in 24 hours.  The glue will flake off over the next 2-3 weeks.  Any sutures or  staples will be removed at the office during your follow-up visit. °8. ACTIVITIES:  You may resume regular (light) daily activities beginning the next day--such as daily self-care, walking, climbing stairs--gradually increasing activities as tolerated.  You may have sexual intercourse when it is comfortable.  Refrain from any heavy lifting or straining until approved by your doctor. °a. You may drive when you are no longer taking prescription pain medication, you can comfortably wear a seatbelt, and you can safely maneuver your car and apply brakes. °b. RETURN TO WORK:  __________________________________________________________ °9. You should see your doctor in the office for a follow-up appointment approximately 2-3 weeks after your surgery.  Make sure that you call for this appointment within a day or two after you arrive home to insure a convenient appointment time. °10. OTHER INSTRUCTIONS: __________________________________________________________________________________________________________________________ __________________________________________________________________________________________________________________________ °WHEN TO CALL YOUR DOCTOR: °1. Fever over 101.0 °2. Inability to urinate °3. Continued bleeding from incision. °4. Increased pain, redness, or drainage from the incision. °5. Increasing abdominal pain ° °The clinic staff is available to answer your questions during regular business hours.  Please don’t hesitate to call and ask to speak to one of the nurses for clinical concerns.  If you have a medical emergency, go to the nearest emergency room or call 911.  A surgeon from [facility] is always on call at the hospital. °[address], [address], [place], [place]  [address] ? [address] [address], [place], [place]   [address] °[phone] ? [phone] ? FAX [phone] °Web site:   [email or web address] °

## 2011-07-18 ENCOUNTER — Encounter (HOSPITAL_COMMUNITY): Payer: Self-pay | Admitting: Surgery

## 2011-07-18 ENCOUNTER — Telehealth (INDEPENDENT_AMBULATORY_CARE_PROVIDER_SITE_OTHER): Payer: Self-pay | Admitting: General Surgery

## 2011-07-18 NOTE — Telephone Encounter (Signed)
Pt called in asking if he could go back to work at [employer] after having had sx 07/08/11. Pt stated he is ready to go back to work, and received note from hospital at discharge stating he was to stay out of work for 2 to 3 weeks and if he felt ready could go back to work sooner, but could not lift more than 15lbs. I advised him to talk to his supervisor and show the note he received directly from hospital in order to limit how much he does have to lift at work. Confirmed with patient his appointment on 07/26/11 needed to be kept. Also spoke with Maralyn Sago who agreed to keep his appointment as scheduled.

## 2011-07-26 ENCOUNTER — Encounter (INDEPENDENT_AMBULATORY_CARE_PROVIDER_SITE_OTHER): Payer: Self-pay | Admitting: General Surgery

## 2011-07-26 ENCOUNTER — Ambulatory Visit (INDEPENDENT_AMBULATORY_CARE_PROVIDER_SITE_OTHER): Payer: BC Managed Care – PPO | Admitting: General Surgery

## 2011-07-26 VITALS — BP 118/70 | HR 70 | Temp 97.4°F | Resp 18 | Ht 69.0 in | Wt 156.8 lb

## 2011-07-26 DIAGNOSIS — K3580 Unspecified acute appendicitis: Secondary | ICD-10-CM

## 2011-07-26 DIAGNOSIS — Z9889 Other specified postprocedural states: Secondary | ICD-10-CM

## 2011-07-26 NOTE — Patient Instructions (Signed)
Follow up as needed

## 2011-07-26 NOTE — Progress Notes (Signed)
Jorge Nicholson 04/23/1991 [id number] 07/26/2011   History of Present Illness: Jorge Nicholson is a  21 y.o. male who presents today status post lap appy by Dr. Ezzard Standing.  Pathology reveals acute suppurative appendicitis.  The patient is tolerating a regular diet, having normal bowel movements, has good pain control.  He  is back to most normal activities.   Physical Exam: Abd: soft, nontender, active bowel sounds, nondistended.  All incisions are well healed.  Impression: 1.  Acute appendicitis, s/p lap appy  Plan: He  is able to return to normal activities. He  may follow up on a prn basis.

## 2012-10-08 ENCOUNTER — Emergency Department (HOSPITAL_COMMUNITY)
Admission: EM | Admit: 2012-10-08 | Discharge: 2012-10-08 | Disposition: A | Discharge status: Home / Self Care | Payer: BC Managed Care – PPO | Attending: Emergency Medicine | Admitting: Emergency Medicine

## 2012-10-08 ENCOUNTER — Encounter (HOSPITAL_COMMUNITY): Payer: Self-pay | Admitting: Emergency Medicine

## 2012-10-08 ENCOUNTER — Emergency Department (HOSPITAL_COMMUNITY): Payer: BC Managed Care – PPO

## 2012-10-08 DIAGNOSIS — F17200 Nicotine dependence, unspecified, uncomplicated: Secondary | ICD-10-CM | POA: Insufficient documentation

## 2012-10-08 DIAGNOSIS — M25539 Pain in unspecified wrist: Secondary | ICD-10-CM | POA: Insufficient documentation

## 2012-10-08 DIAGNOSIS — M25522 Pain in left elbow: Secondary | ICD-10-CM

## 2012-10-08 MED ORDER — IBUPROFEN 800 MG PO TABS
800.0000 mg | ORAL_TABLET | Freq: Once | ORAL | Status: AC
  Administered 2012-10-08: 800 mg via ORAL
  Filled 2012-10-08: qty 1

## 2012-10-08 NOTE — ED Provider Notes (Signed)
History     CSN: [id number]  Arrival date & time 10/08/12  0815   First MD Initiated Contact with Patient 10/08/12 [phone]      Chief Complaint  Patient presents with  . Elbow Pain    (Consider location/radiation/quality/duration/timing/severity/associated sxs/prior treatment) HPI Complains of left elbow pain onset yesterday. Pain is a left posterior elbow. Worse with movementimproved with remaining still. He reports lifting weights 2    days ago and thinks he strained his elbows or as result of the waist. Pain is nonradiating moderate area no other associated symptoms. No fever no other joint aches History reviewed. No pertinent past medical history.  Past Surgical History  Procedure Laterality Date  . Laparoscopic appendectomy  07/08/2011    Procedure: APPENDECTOMY LAPAROSCOPIC;  Surgeon: Kandis Cocking, MD;  Location: [facility];  Service: General;  Laterality: N/A;    No family history on file.  History  Substance Use Topics  . Smoking status: Current Some Day Smoker -- 0.50 packs/day  . Smokeless tobacco: Never Used  . Alcohol Use: 1.2 oz/week    2 Cans of beer per week     Comment: occasionally      Review of Systems  Constitutional: Negative.   Musculoskeletal: Positive for arthralgias.       Left elbow pain    Allergies  Review of patient's allergies indicates no known allergies.  Home Medications  No current outpatient prescriptions on file.  There were no vitals taken for this visit.  Physical Exam  Nursing note and vitals reviewed. Constitutional: He appears well-developed and well-nourished. No distress.  HENT:  Head: Normocephalic and atraumatic.  Eyes: EOM are normal.  Neck: Neck supple. No tracheal deviation present. No thyromegaly present.  Cardiovascular: Normal rate.   No murmur heard. Pulmonary/Chest: Effort normal.  Abdominal: Soft. He exhibits no distension.  Musculoskeletal: He exhibits no edema and no tenderness.  Left upper extremity no  swelling no redness no warmth elbow has pain with active flexion. Radial pulse 2+ he is able to pronate and supinate without difficulty. Hand shoulder wrist elbow range of motion without pain. All other extremities atraumatic. Nontender. Neurovascular intact  Neurological: He is alert. Coordination normal.  Skin: Skin is warm and dry. No rash noted.  Psychiatric: He has a normal mood and affect.    ED Course  Procedures (including critical care time)  Labs Reviewed - No data to display No results found.   No diagnosis found.  Results for orders placed during the hospital encounter of 07/08/11  URINE CULTURE      Result Value Range   Specimen Description URINE, CLEAN CATCH     Special Requests NONE     Culture  Setup Time [id number]     Colony Count 40,000 COLONIES/ML     Culture       Value: Multiple bacterial morphotypes present, none predominant. Suggest appropriate recollection if clinically indicated.   Report Status 07/10/2011 FINAL    SURGICAL PCR SCREEN      Result Value Range   MRSA, PCR NEGATIVE  NEGATIVE   Staphylococcus aureus NEGATIVE  NEGATIVE  CBC      Result Value Range   WBC 16.2 (*) 4.0 - 10.5 K/uL   RBC 4.89  4.22 - 5.81 MIL/uL   Hemoglobin 13.9  13.0 - 17.0 g/dL   HCT 13.0  86.5 - 78.4 %   MCV 81.0  78.0 - 100.0 fL   MCH 28.4  26.0 - 34.0 pg  MCHC 35.1  30.0 - 36.0 g/dL   RDW 16.1  09.6 - 04.5 %   Platelets 180  150 - 400 K/uL  DIFFERENTIAL      Result Value Range   Neutrophils Relative % 82 (*) 43 - 77 %   Neutro Abs 13.3 (*) 1.7 - 7.7 K/uL   Lymphocytes Relative 8 (*) 12 - 46 %   Lymphs Abs 1.3  0.7 - 4.0 K/uL   Monocytes Relative 10  3 - 12 %   Monocytes Absolute 1.5 (*) 0.1 - 1.0 K/uL   Eosinophils Relative 0  0 - 5 %   Eosinophils Absolute 0.0  0.0 - 0.7 K/uL   Basophils Relative 0  0 - 1 %   Basophils Absolute 0.1  0.0 - 0.1 K/uL   WBC Morphology MILD LEFT SHIFT (1-5% METAS, OCC MYELO, OCC BANDS)     RBC Morphology POLYCHROMASIA PRESENT      Smear Review LARGE PLATELETS PRESENT    BASIC METABOLIC PANEL      Result Value Range   Sodium 126 (*) 135 - 145 mEq/L   Potassium 2.8 (*) 3.5 - 5.1 mEq/L   Chloride 85 (*) 96 - 112 mEq/L   CO2 27  19 - 32 mEq/L   Glucose, Bld 106 (*) 70 - 99 mg/dL   BUN 9  6 - 23 mg/dL   Creatinine, Ser 4.09  0.50 - 1.35 mg/dL   Calcium 9.1  8.4 - 81.1 mg/dL   GFR calc non Af Amer >90  >90 mL/min   GFR calc Af Amer >90  >90 mL/min  URINALYSIS, ROUTINE W REFLEX MICROSCOPIC      Result Value Range   Color, Urine AMBER (*) YELLOW   APPearance CLOUDY (*) CLEAR   Specific Gravity, Urine 1.018  1.005 - 1.030   pH 6.0  5.0 - 8.0   Glucose, UA NEGATIVE  NEGATIVE mg/dL   Hgb urine dipstick SMALL (*) NEGATIVE   Bilirubin Urine NEGATIVE  NEGATIVE   Ketones, ur TRACE (*) NEGATIVE mg/dL   Protein, ur 914 (*) NEGATIVE mg/dL   Urobilinogen, UA 0.2  0.0 - 1.0 mg/dL   Nitrite NEGATIVE  NEGATIVE   Leukocytes, UA NEGATIVE  NEGATIVE  LIPASE, BLOOD      Result Value Range   Lipase 18  11 - 59 U/L  URINE MICROSCOPIC-ADD ON      Result Value Range   WBC, UA 7-10  <3 WBC/hpf   RBC / HPF 3-6  <3 RBC/hpf  CBC      Result Value Range   WBC 16.2 (*) 4.0 - 10.5 K/uL   RBC 4.38  4.22 - 5.81 MIL/uL   Hemoglobin 12.6 (*) 13.0 - 17.0 g/dL   HCT 78.2 (*) 95.6 - 21.3 %   MCV 82.0  78.0 - 100.0 fL   MCH 28.8  26.0 - 34.0 pg   MCHC 35.1  30.0 - 36.0 g/dL   RDW 08.6  57.8 - 46.9 %   Platelets 168  150 - 400 K/uL  DIFFERENTIAL      Result Value Range   Neutrophils Relative % 86 (*) 43 - 77 %   Lymphocytes Relative 9 (*) 12 - 46 %   Monocytes Relative 5  3 - 12 %   Eosinophils Relative 0  0 - 5 %   Basophils Relative 0  0 - 1 %   Neutro Abs 13.9 (*) 1.7 - 7.7 K/uL   Lymphs Abs 1.5  0.7 - 4.0 K/uL   Monocytes Absolute 0.8  0.1 - 1.0 K/uL   Eosinophils Absolute 0.0  0.0 - 0.7 K/uL   Basophils Absolute 0.0  0.0 - 0.1 K/uL   WBC Morphology INCREASED BANDS (>20% BANDS)    BASIC METABOLIC PANEL      Result Value  Range   Sodium 128 (*) 135 - 145 mEq/L   Potassium 3.5  3.5 - 5.1 mEq/L   Chloride 90 (*) 96 - 112 mEq/L   CO2 31  19 - 32 mEq/L   Glucose, Bld 114 (*) 70 - 99 mg/dL   BUN 9  6 - 23 mg/dL   Creatinine, Ser 0.98  0.50 - 1.35 mg/dL   Calcium 8.1 (*) 8.4 - 10.5 mg/dL   GFR calc non Af Amer >90  >90 mL/min   GFR calc Af Amer >90  >90 mL/min  CBC      Result Value Range   WBC 14.1 (*) 4.0 - 10.5 K/uL   RBC 4.37  4.22 - 5.81 MIL/uL   Hemoglobin 12.4 (*) 13.0 - 17.0 g/dL   HCT 11.9 (*) 14.7 - 82.9 %   MCV 82.6  78.0 - 100.0 fL   MCH 28.4  26.0 - 34.0 pg   MCHC 34.3  30.0 - 36.0 g/dL   RDW 56.2  13.0 - 86.5 %   Platelets 174  150 - 400 K/uL  BASIC METABOLIC PANEL      Result Value Range   Sodium 133 (*) 135 - 145 mEq/L   Potassium 3.2 (*) 3.5 - 5.1 mEq/L   Chloride 96  96 - 112 mEq/L   CO2 32  19 - 32 mEq/L   Glucose, Bld 126 (*) 70 - 99 mg/dL   BUN 6  6 - 23 mg/dL   Creatinine, Ser 7.84  0.50 - 1.35 mg/dL   Calcium 7.9 (*) 8.4 - 10.5 mg/dL   GFR calc non Af Amer >90  >90 mL/min   GFR calc Af Amer >90  >90 mL/min  CBC      Result Value Range   WBC 13.3 (*) 4.0 - 10.5 K/uL   RBC 4.41  4.22 - 5.81 MIL/uL   Hemoglobin 12.5 (*) 13.0 - 17.0 g/dL   HCT 69.6 (*) 29.5 - 28.4 %   MCV 83.2  78.0 - 100.0 fL   MCH 28.3  26.0 - 34.0 pg   MCHC 34.1  30.0 - 36.0 g/dL   RDW 13.2  44.0 - 10.2 %   Platelets 207  150 - 400 K/uL  BASIC METABOLIC PANEL      Result Value Range   Sodium 133 (*) 135 - 145 mEq/L   Potassium 3.7  3.5 - 5.1 mEq/L   Chloride 96  96 - 112 mEq/L   CO2 31  19 - 32 mEq/L   Glucose, Bld 96  70 - 99 mg/dL   BUN 6  6 - 23 mg/dL   Creatinine, Ser 7.25  0.50 - 1.35 mg/dL   Calcium 8.1 (*) 8.4 - 10.5 mg/dL   GFR calc non Af Amer >90  >90 mL/min   GFR calc Af Amer >90  >90 mL/min   Dg Elbow Complete Left  10/08/2012   *RADIOLOGY REPORT*  Clinical Data: Pain and swelling  LEFT ELBOW - COMPLETE 3+ VIEW  Comparison: None.  Findings: No detectable joint effusion.  No  evidence of degenerative change.  No fracture or other focal lesion.  IMPRESSION: Negative radiographs  Original Report Authenticated By: Paulina Fusi, M.D.    X-ray viewed by me MDM  No signs of infection Pain likely secondary to overuse Plan ibuprofen, ice, rest orthopedic referral as needed 3 days Diagnosis left elbow pain        Doug Sou, MD 10/08/12 [phone]

## 2012-10-08 NOTE — ED Notes (Signed)
Bed:WA04<BR> Expected date:<BR> Expected time:<BR> Means of arrival:<BR> Comments:<BR>

## 2012-10-08 NOTE — ED Notes (Signed)
Pt states that yesterday his left elbow started hurting when he would bend it.  States that today it hurts all the time.  Denies injury.

## 2012-10-08 NOTE — ED Notes (Signed)
Ice pack given to pt.

## 2013-01-02 ENCOUNTER — Emergency Department (HOSPITAL_COMMUNITY)
Admission: EM | Admit: 2013-01-02 | Discharge: 2013-01-02 | Disposition: A | Discharge status: Home / Self Care | Payer: BC Managed Care – PPO | Attending: Emergency Medicine | Admitting: Emergency Medicine

## 2013-01-02 ENCOUNTER — Emergency Department (HOSPITAL_COMMUNITY): Payer: BC Managed Care – PPO

## 2013-01-02 ENCOUNTER — Encounter (HOSPITAL_COMMUNITY): Payer: Self-pay | Admitting: Emergency Medicine

## 2013-01-02 DIAGNOSIS — M25529 Pain in unspecified elbow: Secondary | ICD-10-CM | POA: Insufficient documentation

## 2013-01-02 DIAGNOSIS — M25521 Pain in right elbow: Secondary | ICD-10-CM

## 2013-01-02 DIAGNOSIS — F17200 Nicotine dependence, unspecified, uncomplicated: Secondary | ICD-10-CM | POA: Insufficient documentation

## 2013-01-02 DIAGNOSIS — IMO0001 Reserved for inherently not codable concepts without codable children: Secondary | ICD-10-CM | POA: Insufficient documentation

## 2013-01-02 DIAGNOSIS — M25429 Effusion, unspecified elbow: Secondary | ICD-10-CM | POA: Insufficient documentation

## 2013-01-02 MED ORDER — HYDROCODONE-ACETAMINOPHEN 5-325 MG PO TABS
1.0000 | ORAL_TABLET | Freq: Once | ORAL | Status: AC
  Administered 2013-01-02: 1 via ORAL
  Filled 2013-01-02: qty 1

## 2013-01-02 MED ORDER — HYDROCODONE-ACETAMINOPHEN 5-325 MG PO TABS: 1.0000 | ORAL_TABLET | ORAL | Status: DC | PRN

## 2013-01-02 NOTE — ED Notes (Signed)
Pt c/o lt arm pain since yesterday.  Denies injury.

## 2013-01-02 NOTE — ED Provider Notes (Signed)
CSN: [id number]     Arrival date & time 01/02/13  1040 History     First MD Initiated Contact with Patient 01/02/13 1135     Chief Complaint  Patient presents with  . Arm Pain   (Consider location/radiation/quality/duration/timing/severity/associated sxs/prior Treatment) HPI Comments: Patient is a 22 year old male presented to the emergency department for constant sharp right elbow pain with radiation to shoulder and down to her wrist that began yesterday afternoon while at work. 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 He denies any injury or falls. Patient states his pain is worsened with flexion and extension of the elbow. He denies any alleviating factors but did not try any over-the-counter medications. Denies any numbness or tingling in the right arm. Patient is right-handed.  Patient is a 22 y.o. male presenting with arm pain.  Arm Pain Associated symptoms include arthralgias, joint swelling and myalgias. Pertinent negatives include no fever.    History reviewed. No pertinent past medical history. Past Surgical History  Procedure Laterality Date  . Laparoscopic appendectomy  07/08/2011    Procedure: APPENDECTOMY LAPAROSCOPIC;  Surgeon: Kandis Cocking, MD;  Location: [facility];  Service: General;  Laterality: N/A;   No family history on file. History  Substance Use Topics  . Smoking status: Current Some Day Smoker -- 0.50 packs/day  . Smokeless tobacco: Never Used  . Alcohol Use: 1.2 oz/week    2 Cans of beer per week     Comment: occasionally    Review of Systems  Constitutional: Negative for fever.  Musculoskeletal: Positive for myalgias, joint swelling and arthralgias.  Skin: Negative.     Allergies  Review of patient's allergies indicates no known allergies.  Home Medications   Current Outpatient Rx  Name  Route  Sig  Dispense  Refill  . HYDROcodone-acetaminophen (NORCO/VICODIN) 5-325 MG per tablet   Oral   Take 1 tablet by mouth every 4  (four) hours as needed for pain.   20 tablet   0    BP 121/70  Pulse 46  Temp(Src) 99.2 F (37.3 C) (Oral)  Resp 20  SpO2 100% Physical Exam  Constitutional: He is oriented to person, place, and time. He appears well-developed and well-nourished. No distress.  HENT:  Head: Normocephalic and atraumatic.  Eyes: Conjunctivae are normal.  Neck: Neck supple.  Musculoskeletal:       Right shoulder: Normal.       Right elbow: He exhibits decreased range of motion and swelling. He exhibits no effusion and no deformity. Tenderness found. Medial epicondyle and lateral epicondyle tenderness noted.       Right upper arm: Normal.       Right forearm: Normal. He exhibits no edema, no deformity and no laceration.       Right hand: Normal.  Neurological: He is alert and oriented to person, place, and time.  Skin: Skin is warm and dry. He is not diaphoretic.  Psychiatric: He has a normal mood and affect.    ED Course   Procedures (including critical care time)  Labs Reviewed - No data to display Dg Elbow Complete Right  01/02/2013   *RADIOLOGY REPORT*  Clinical Data: Posterior elbow pain spreading up and down arm, no known injury  RIGHT ELBOW - COMPLETE 3+ VIEW  Comparison: None  Findings: Bone mineralization normal. Joint spaces preserved. No fracture, dislocation, or bone destruction. No joint effusion.  IMPRESSION: No acute osseous abnormalities.   Original Report Authenticated By: Ulyses Southward,  M.D.   1. Elbow pain, right     MDM  Afebrile, NAD, non-toxic, AAOx4. Neurovascularly intact. Sensation intact. Imaging shows no fracture. Directed pt to ice injury, take acetaminophen or ibuprofen for pain, and to elevate and rest the injury when possible. Provided sling for comfort. Return precautions discussed. Advised f/u w/ orthopedist. Patient is agreeable to plan. Patient is stable at time of discharge    Jeannetta Ellis, PA-C 01/02/13 2000

## 2013-01-03 NOTE — ED Provider Notes (Signed)
Medical screening examination/treatment/procedure(s) were performed by non-physician practitioner and as supervising physician I was immediately available for consultation/collaboration.    Tushar Enns R Marilee Ditommaso, MD 01/03/13 1537 

## 2013-05-01 IMAGING — CT CT ABD-PELV W/ CM
1 of 2 series · 15 of 32 positions shown, 19 images · IV contrast (APPLIED)
Comparison: None

CLINICAL DATA: 20-year-old male with abdominal and pelvic pain.

CT ABDOMEN AND PELVIS WITH CONTRAST
TECHNIQUE: Multidetector CT imaging of the abdomen and pelvis was
performed following the standard protocol during bolus
administration of intravenous contrast.
Contrast: 100mL OMNIPAQUE IOHEXOL 300 MG/ML IV SOLN

[Series 2: abd/pel with · axial · 0.66mm/px · z∈[-882,-472]mm · 15 of 90 slices shown, 19 images]
[im 4/90  soft-tissue]
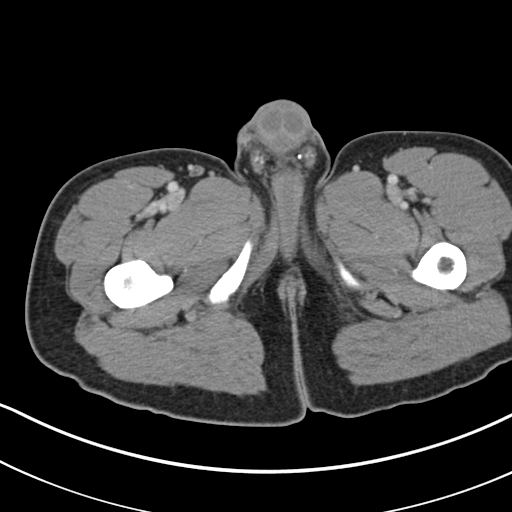
[im 4/90  bone]
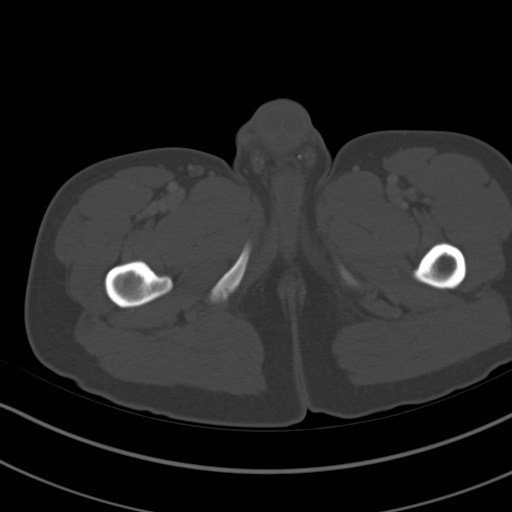
[im 12/90  soft-tissue]
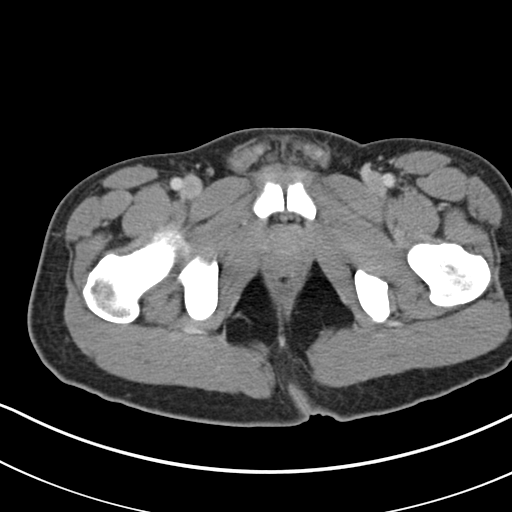
[im 19/90  soft-tissue]
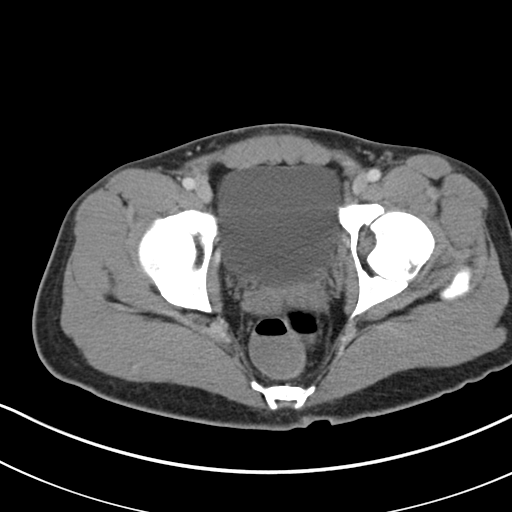
[im 26/90  soft-tissue]
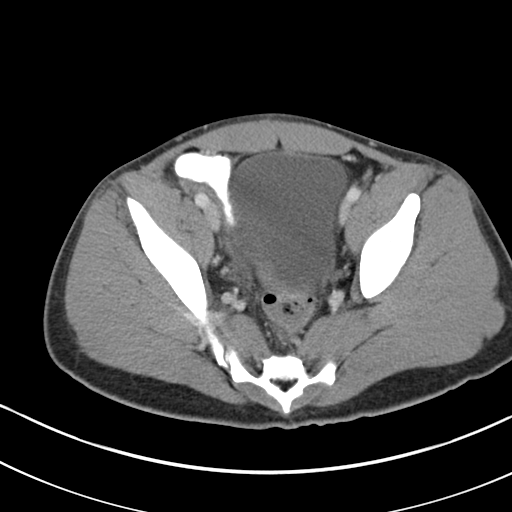
[im 30/90  soft-tissue]
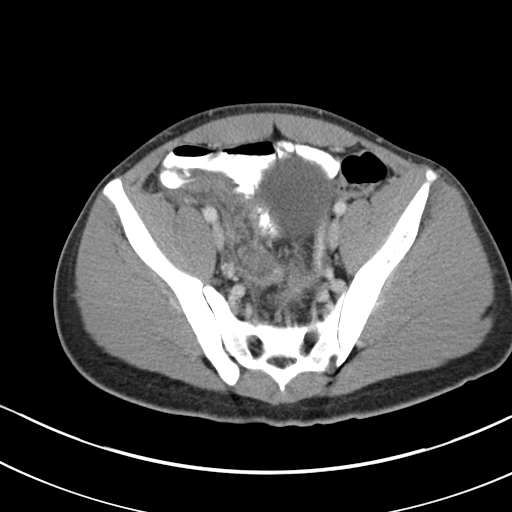
[im 38/90  soft-tissue]
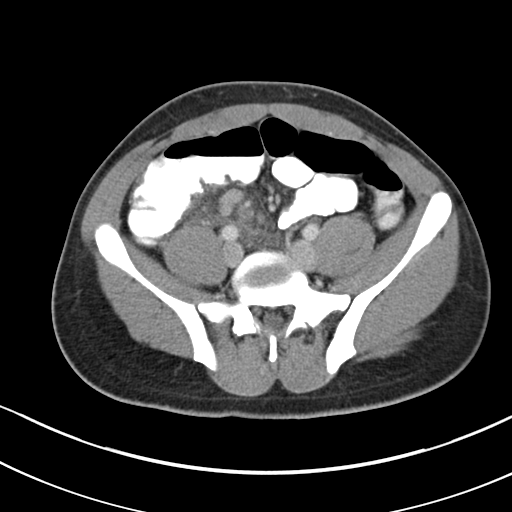
[im 45/90  soft-tissue]
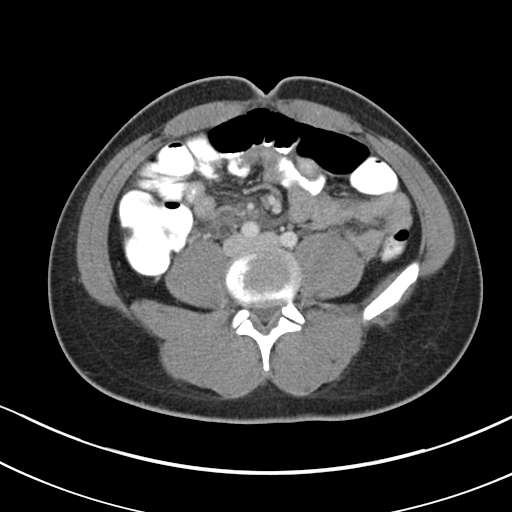
[im 52/90  soft-tissue]
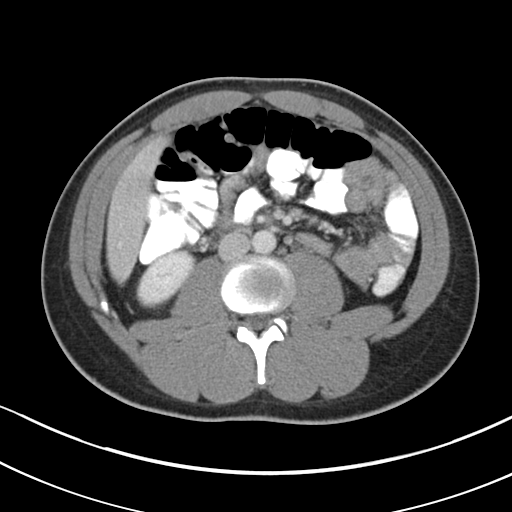
[im 60/90  soft-tissue]
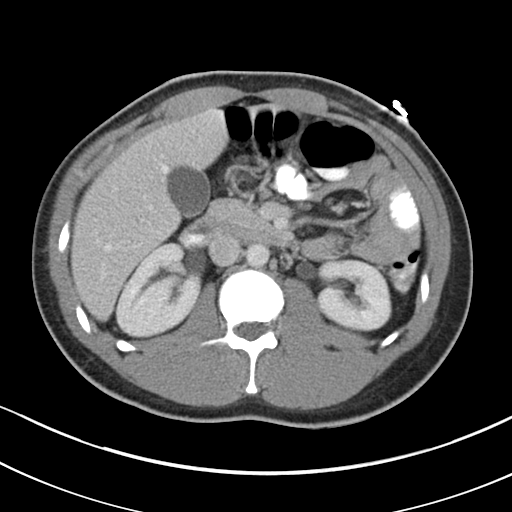
[im 60/90  bone]
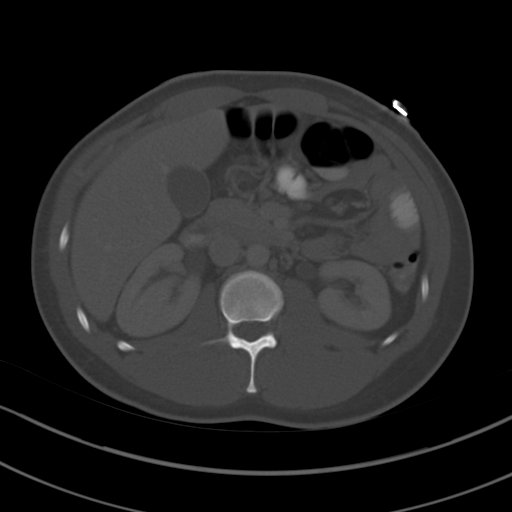
[im 64/90  soft-tissue]
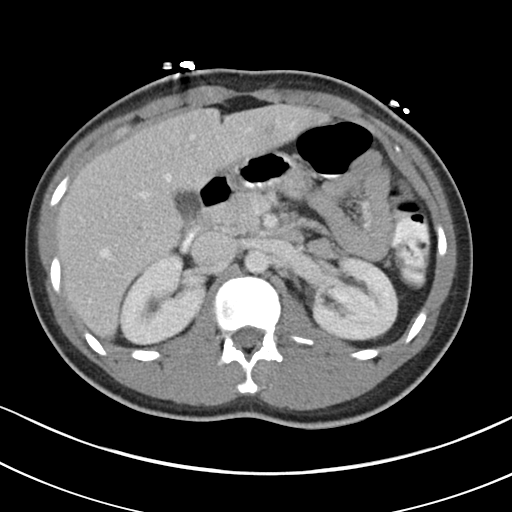
[im 71/90  soft-tissue]
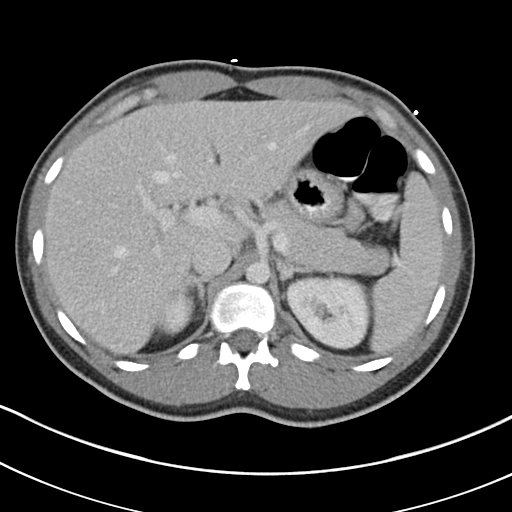
[im 75/90  lung]
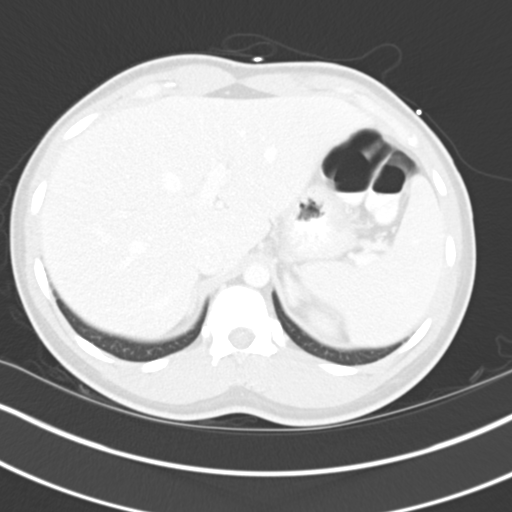
[im 78/90  soft-tissue]
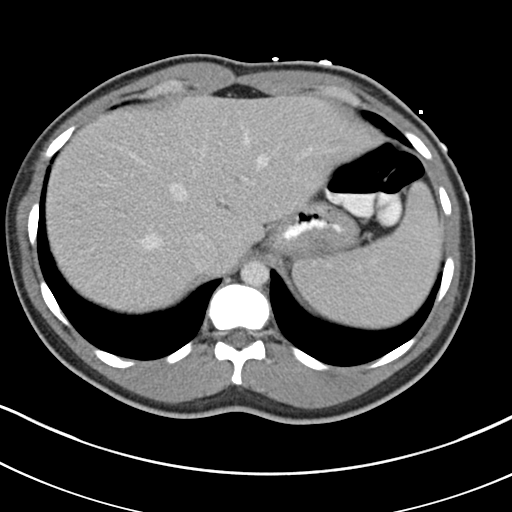
[im 78/90  lung]
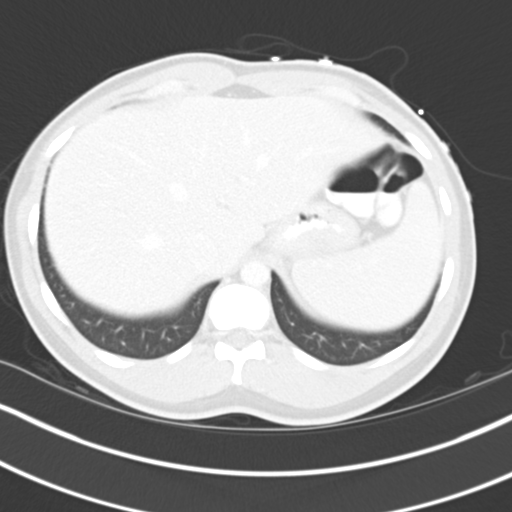
[im 82/90  lung]
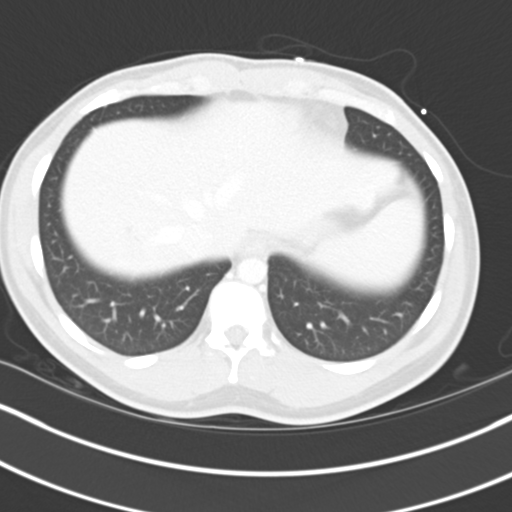
[im 86/90  soft-tissue]
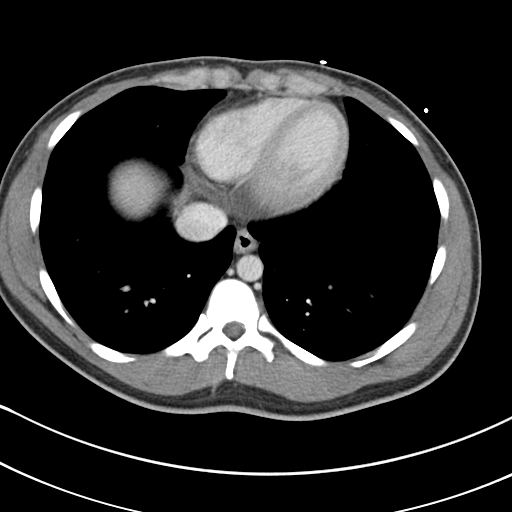
[im 86/90  lung]
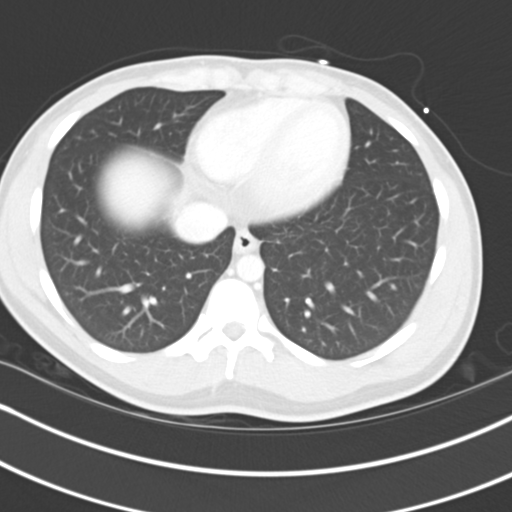

[15 of 32 positions shown; findings below may reference images not displayed]

FINDINGS: The appendix is thickened with a moderate to large amount
of inflammation within the right pelvis compatible with
appendicitis.  A 2 cm focal area of low attenuation with two small
foci of gas (image [DATE] represent a dilated appendiceal tip but
more suspicious for appendiceal rupture and phlegmon. Enlarged
right mesenteric lymph nodes are identified and likely reactive.

There is no evidence of free fluid, biliary dilatation or abdominal
aortic aneurysm.

The liver, spleen, kidneys, pancreas and gallbladder are
unremarkable.

No other bowel abnormalities are identified.  There is no evidence
of bowel obstruction.
The bladder is within normal limits.
No acute or suspicious bony abnormalities are identified.
IMPRESSION: Appendicitis with findings likely representing rupture and
phlegmon.

These results were called to Dr. Ntena on 07/08/2011 at [DATE] p.m..

## 2019-11-12 ENCOUNTER — Encounter: Payer: Self-pay | Admitting: Emergency Medicine

## 2019-11-12 ENCOUNTER — Ambulatory Visit
Admission: EM | Admit: 2019-11-12 | Discharge: 2019-11-12 | Disposition: A | Discharge status: Home / Self Care | Payer: Self-pay | Attending: Emergency Medicine | Admitting: Emergency Medicine

## 2019-11-12 ENCOUNTER — Other Ambulatory Visit: Payer: Self-pay

## 2019-11-12 DIAGNOSIS — L299 Pruritus, unspecified: Secondary | ICD-10-CM

## 2019-11-12 MED ORDER — HYDROXYZINE HCL 25 MG PO TABS
25.0000 mg | ORAL_TABLET | Freq: Four times a day (QID) | ORAL | 0 refills | Status: DC | PRN
Start: 2019-11-12 — End: 2022-12-30

## 2019-11-12 MED ORDER — PREDNISONE 10 MG (21) PO TBPK
ORAL_TABLET | ORAL | 0 refills | Status: DC
Start: 2019-11-12 — End: 2022-12-30

## 2019-11-12 NOTE — Discharge Instructions (Addendum)
discontinue Benadryl, start Claritin or Zyrtec, and if that does not work, Atarax.  6-day prednisone taper.  Continue cool showers.  CeraVe lotion afterwards while skin is still damp.  Here is a list of primary care providers who are taking new patients:  Dr. Elizabeth Sauer [address] [place] [address] [phone]  [facility] [address] [place] [address]  [phone]  [facility] [address] [place], [place] [address] [phone]  [facility] [address] [place]  [phone] [place], [place] [address]  Here are clinics/ other resources who will see you if you do not have insurance. Some have certain criteria that you must meet. Call them and find out what they are:  [facility]: [address], [place], [place] [address] Phone: [phone] Hours: First and Third Saturdays of each Month, 9 a.m. - 1 p.m.  [facility]: [address] [place], [place] [address] Phone: [phone] Hours: Tuesday, 4 p.m. - 8 p.m. Thursday, 1 p.m. - 8 p.m. Wednesday, 9 a.m. - [facility] [address], [place], [place] [address] Phone: [phone] Pharmacy Phone Number: [phone] Dental Phone Number: [phone] [facility] Insurance Help: [phone]  Dental Hours: Monday - Thursday, 8 a.m. - 6 p.m.  [facility] [address], [place], [place] [address] Phone: [phone] Pharmacy Phone Number: [phone] [facility] Insurance Help: [phone]  [facility] [address] [place] [place], [place] [address] Phone: [phone] Pharmacy Phone Number: [phone] [facility] Insurance Help: [phone]  [facility] [address] [place], [place] [address] Phone: [phone] [facility] Insurance Help: [phone]   [facility] [address], [place], [place] [address] Phone: [phone]  Go to www.goodrx.com to  look up your medications. This will give you a list of where you can find your prescriptions at the most affordable prices. Or ask the pharmacist what the cash price is, or if they have any other discount programs available to help make your medication more affordable. This can be less expensive than what you would pay with insurance.

## 2019-11-12 NOTE — ED Triage Notes (Signed)
Patient states he was in a pool 2 weeks ago and has had itching since then. He itches all over his body.

## 2019-11-12 NOTE — ED Provider Notes (Signed)
HPI  SUBJECTIVE:  Jorge Nicholson is a 29 y.o. male who presents with 2 weeks of generalized pruritus.  He states symptoms started after swimming in a pool with his wife.  His wife is asymptomatic.  No dry flaky skin, sunburn, rash, fevers, body aches.  No new lotions, soaps, detergents.  No sensation of being bitten at night.  He states the itching initially was worse at night, but is now itchy all day long.  He states that the itching has not spread since it started.  No pets in the house.  No wheezing, shortness of breath, cough, lip or tongue swelling, hives.  No known exposure to poison ivy, poison oak.  He tried Benadryl with improvement in his symptoms.  He also tried Aveeno starting today and cold showers.  Symptoms are worse after showering.  Past medical history negative for eczema, allergies, asthma.  PMD: None.    History reviewed. No pertinent past medical history.  Past Surgical History:  Procedure Laterality Date  . LAPAROSCOPIC APPENDECTOMY  07/08/2011   Procedure: APPENDECTOMY LAPAROSCOPIC;  Surgeon: Kandis Cocking, MD;  Location: [facility];  Service: General;  Laterality: N/A;    History reviewed. No pertinent family history.  Social History   Tobacco Use  . Smoking status: Current Some Day Smoker    Packs/day: 0.50  . Smokeless tobacco: Never Used  Substance Use Topics  . Alcohol use: Yes    Alcohol/week: 2.0 standard drinks    Types: 2 Cans of beer per week    Comment: occasionally  . Drug use: Yes    Frequency: 2.0 times per week    Types: Marijuana    No current facility-administered medications for this encounter.  Current Outpatient Medications:  .  hydrOXYzine (ATARAX/VISTARIL) 25 MG tablet, Take 1 tablet (25 mg total) by mouth every 6 (six) hours as needed for itching., Disp: 20 tablet, Rfl: 0 .  predniSONE (STERAPRED UNI-PAK 21 TAB) 10 MG (21) TBPK tablet, Dispense one 6 day pack. Take as directed with food., Disp: 21 tablet, Rfl: 0  No Known  Allergies   ROS  As noted in HPI.   Physical Exam  BP 133/77 (BP Location: Right Arm)   Pulse (!) 58   Temp 98.2 F (36.8 C) (Oral)   Resp 18   Ht 5\' 9"  (1.753 m)   Wt 88 kg   SpO2 100%   BMI 28.65 kg/m   Constitutional: Well developed, well nourished, no acute distress Eyes:  EOMI, conjunctiva normal bilaterally HENT: Normocephalic, atraumatic,mucus membranes moist Respiratory: Normal inspiratory effort Cardiovascular: Normal rate GI: nondistended skin: No rash over torso, face, upper extremities, distal lower extremities.  1 noninfected excoriation on forearm.  Skin otherwise intact.  No burrows between fingers, no erythema, no papules, vesicles, crusting,  Musculoskeletal: no deformities Neurologic: Alert & oriented x 3, no focal neuro deficits Psychiatric: Speech and behavior appropriate   ED Course   Medications - No data to display  No orders of the defined types were placed in this encounter.   No results found for this or any previous visit (from the past 24 hour(s)). No results found.  ED Clinical Impression  1. Pruritic dermatitis      ED Assessment/Plan  Patient with generalized pruritus.  No evidence of infection, eczema, tinea, pityriasis, scabies, contact dermatitis, bedbug bites, flea bites.  Unsure as to the etiology of his symptoms.  We will have him discontinue Benadryl, start Claritin or Zyrtec, and if that does not  work, Atarax.  6-day prednisone taper.  Continue cool showers.  CeraVe lotion afterwards.  Primary care referral for ongoing care.  Will refer to Dr. Ebony Cargo, dermatology if these do not resolve his symptoms.  Meds ordered this encounter  Medications  . predniSONE (STERAPRED UNI-PAK 21 TAB) 10 MG (21) TBPK tablet    Sig: Dispense one 6 day pack. Take as directed with food.    Dispense:  21 tablet    Refill:  0  . hydrOXYzine (ATARAX/VISTARIL) 25 MG tablet    Sig: Take 1 tablet (25 mg total) by mouth every 6 (six) hours  as needed for itching.    Dispense:  20 tablet    Refill:  0    *This clinic note was created using Scientist, clinical (histocompatibility and immunogenetics). Therefore, there may be occasional mistakes despite careful proofreading.   ?    Domenick Gong, MD 11/12/19 2048

## 2022-10-23 DIAGNOSIS — M79671 Pain in right foot: Secondary | ICD-10-CM | POA: Diagnosis not present

## 2022-10-23 DIAGNOSIS — S93601A Unspecified sprain of right foot, initial encounter: Secondary | ICD-10-CM | POA: Diagnosis not present

## 2022-12-13 ENCOUNTER — Encounter: Payer: Self-pay | Admitting: Emergency Medicine

## 2022-12-13 ENCOUNTER — Other Ambulatory Visit: Payer: Self-pay

## 2022-12-13 ENCOUNTER — Emergency Department: Payer: BC Managed Care – PPO

## 2022-12-13 ENCOUNTER — Emergency Department
Admission: EM | Admit: 2022-12-13 | Discharge: 2022-12-13 | Disposition: A | Discharge status: Home / Self Care | Payer: BC Managed Care – PPO | Attending: Emergency Medicine | Admitting: Emergency Medicine

## 2022-12-13 DIAGNOSIS — T59811A Toxic effect of smoke, accidental (unintentional), initial encounter: Secondary | ICD-10-CM | POA: Insufficient documentation

## 2022-12-13 DIAGNOSIS — J705 Respiratory conditions due to smoke inhalation: Secondary | ICD-10-CM | POA: Diagnosis not present
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
## 2022-12-13 NOTE — Discharge Instructions (Signed)
Your workup in the Emergency Department today was reassuring.  We did not find any specific abnormalities.  We recommend you drink plenty of fluids, take your regular medications and/or any new ones prescribed today, and follow up with the doctor(s) listed in these documents as recommended.  Return to the Emergency Department if you develop new or worsening symptoms that concern you.  

## 2022-12-23 ENCOUNTER — Ambulatory Visit: Payer: Self-pay | Admitting: Family Medicine

## 2022-12-30 ENCOUNTER — Ambulatory Visit (INDEPENDENT_AMBULATORY_CARE_PROVIDER_SITE_OTHER): Payer: BC Managed Care – PPO | Admitting: Family Medicine

## 2022-12-30 ENCOUNTER — Encounter: Payer: Self-pay | Admitting: Family Medicine

## 2022-12-30 VITALS — BP 138/88 | HR 78 | Ht 69.0 in | Wt 218.0 lb

## 2022-12-30 DIAGNOSIS — Z1159 Encounter for screening for other viral diseases: Secondary | ICD-10-CM

## 2022-12-30 DIAGNOSIS — M109 Gout, unspecified: Secondary | ICD-10-CM | POA: Insufficient documentation

## 2022-12-30 DIAGNOSIS — E559 Vitamin D deficiency, unspecified: Secondary | ICD-10-CM

## 2022-12-30 DIAGNOSIS — R5383 Other fatigue: Secondary | ICD-10-CM | POA: Diagnosis not present

## 2022-12-30 DIAGNOSIS — Z0000 Encounter for general adult medical examination without abnormal findings: Secondary | ICD-10-CM

## 2022-12-30 DIAGNOSIS — M1A0710 Idiopathic chronic gout, right ankle and foot, without tophus (tophi): Secondary | ICD-10-CM

## 2022-12-30 DIAGNOSIS — Z13220 Encounter for screening for lipoid disorders: Secondary | ICD-10-CM

## 2022-12-30 DIAGNOSIS — Z114 Encounter for screening for human immunodeficiency virus [HIV]: Secondary | ICD-10-CM

## 2022-12-30 NOTE — Patient Instructions (Addendum)
-   Obtain fasting labs with orders provided (no food/drink besides water x 8 hours) - Continue healthy lifestyle changes to minimize gout recurrence - Review formation attached and below, make changes where applicable - Return for annual physical once scheduled  Sleep hygiene advice  When possible, maximize regularity in activity and sleep schedule   Regularity in the timing of sleep, food intake, and social activity helps to stabilize the biological clock.Minimizing discrepancies in sleep timing between on-shift and off-shift periods may help you adapt to a fixed-shift schedule and may also help you adapt to each shift type in a rotating-shift schedule (depending onrotation speed and direction).   Create a sleep-friendly bedroom environment   Make sure that your bed is comfortable and that your bedroom is dark, quiet, and cool (around 67F or 18C).Blackout shades may be particularly important to block sunlight during daytime sleep. Creating constantbackground noise in the sleep environment with a fan or humidifier, for example, will eliminate unexpectedsounds that would otherwise wake you up.   Limit exposure to bright light before daytime sleep   Exposure to bright light (eg, sunlight during the morning commute home following a night shift) can bealerting and may also set your biological clock to a time that interferes with daytime sleep.   Make the last hour before bed a "wind-down" time   Engage in relaxing and pleasant activities, dim or block light in the room, and have a light snack.   Do not use alcohol to help you sleep and do not consume alcohol too close to bedtime   Although alcohol may help you to fall asleep more easily, it disrupts your sleep during the night by causingfrequent awakenings. One drink of alcohol should not be consumed within three hours of bedtime.   Smoking and other drugs will disrupt your sleep   If you smoke, do not smoke too close to bedtime or if you  wake up during the intended sleep period. Mostdrugs of abuse can disrupt sleep.   Avoid caffeinated products within six hours of bedtime   In addition to coffee, these may include tea, chocolate, and many sodas.   Exercise regularly, but avoid activities that raise body temperature close to bedtime   Regular exercise can improve sleep quality, but exercising or having a warm bath too close to bedtime candisrupt your ability to fall asleep. Warm baths should be avoided within 1.5 hours of bedtime.   Avoid consuming more than 8 to 10 ounces of liquids close to bedtime   A full or semi-full bladder can contribute to awakenings. Restrict liquids close to bedtime and empty yourbladder just before going to bed.

## 2022-12-30 NOTE — Assessment & Plan Note (Signed)
Chronic issue, currently well-controlled with lifestyle changes, typical pain localizes to the right great toe, pointing to the first MTP.  Plan: - Will obtain baseline uric acid level as part of lab workup - Continue healthy lifestyle changes to minimize gout recurrence - Review formation attached and make changes where applicable

## 2022-12-30 NOTE — Progress Notes (Signed)
Primary Care / Sports Medicine Office Visit  Patient Information:  Patient ID: CLEVON RUBENDALL, male DOB: [birth year] Age: 32 y.o. MRN: [id number]   AREN SAUPE is a pleasant 32 y.o. male presenting with the following:  Chief Complaint  Patient presents with   Establish Care    Vitals:   12/30/22 1007  BP: 138/88  Pulse: 78  SpO2: 98%   Vitals:   12/30/22 1007  Weight: 218 lb (98.9 kg)  Height: 5\' 9"  (1.753 m)   Body mass index is 32.19 kg/m.     Independent interpretation of notes and tests performed by another provider:   None  Procedures performed:   None  Pertinent History, Exam, Impression, and Recommendations:   Khanh was seen today for establish care.  Chronic idiopathic gout involving toe of right foot without tophus Overview: well controlled with lifestyle modifications (EtOH)  Assessment & Plan: Chronic issue, currently well-controlled with lifestyle changes, typical pain localizes to the right great toe, pointing to the first MTP.  Plan: - Will obtain baseline uric acid level as part of lab workup - Continue healthy lifestyle changes to minimize gout recurrence - Review formation attached and make changes where applicable  Orders: -     Uric acid  Other fatigue Assessment & Plan: Ongoing for several months in the setting of young child.  Does report some chronicity previously well-controlled with regular exercise.  He reports roughly 6 hours of sleep nightly, their young child sleeps in the same bed, has been told that he snores in the past.  No significant daytime somnolence reported.  We reviewed various etiologies, patient has deferred sleep study for now.  Plan: - Risk stratification labs ordered - Sleep hygiene information provided - Await lab results, follow accordingly, for persistent symptomatology, we can consider home sleep study  Orders: -     CBC -     Comprehensive metabolic panel -     TSH -     VITAMIN D 25  Hydroxy (Vit-D Deficiency, Fractures) -     Testosterone,Free and Total  Screening for HIV (human immunodeficiency virus) -     HIV Antibody (routine testing w rflx)  Need for hepatitis C screening test -     Hepatitis C antibody  Screening for lipoid disorders -     Apo A1 + B + Ratio -     Comprehensive metabolic panel -     Lipid panel  Annual physical exam -     Apo A1 + B + Ratio -     CBC -     Comprehensive metabolic panel -     Hepatitis C antibody -     HIV Antibody (routine testing w rflx) -     Lipid panel -     TSH -     VITAMIN D 25 Hydroxy (Vit-D Deficiency, Fractures) -     Testosterone,Free and Total -     Uric acid  Vitamin D deficiency  Healthcare maintenance -     Apo A1 + B + Ratio -     CBC -     Comprehensive metabolic panel -     Hepatitis C antibody -     HIV Antibody (routine testing w rflx) -     Lipid panel -     TSH -     VITAMIN D 25 Hydroxy (Vit-D Deficiency, Fractures) -     Testosterone,Free and Total -  Uric acid     Orders & Medications No orders of the defined types were placed in this encounter.  Orders Placed This Encounter  Procedures   Apo A1 + B + Ratio   CBC   Comprehensive metabolic panel   Hepatitis C antibody   HIV Antibody (routine testing w rflx)   Lipid panel   TSH   VITAMIN D 25 Hydroxy (Vit-D Deficiency, Fractures)   Testosterone,Free and Total   Uric acid     No follow-ups on file.     Jerrol Banana, MD, [facility]   Primary Care Sports Medicine Primary Care and Sports Medicine at [facility]

## 2022-12-30 NOTE — Assessment & Plan Note (Signed)
Ongoing for several months in the setting of young child.  Does report some chronicity previously well-controlled with regular exercise.  He reports roughly 6 hours of sleep nightly, their young child sleeps in the same bed, has been told that he snores in the past.  No significant daytime somnolence reported.  We reviewed various etiologies, patient has deferred sleep study for now.  Plan: - Risk stratification labs ordered - Sleep hygiene information provided - Await lab results, follow accordingly, for persistent symptomatology, we can consider home sleep study

## 2023-03-21 ENCOUNTER — Encounter: Payer: BC Managed Care – PPO | Admitting: Family Medicine

## 2023-06-30 ENCOUNTER — Encounter: Payer: Self-pay | Admitting: Family Medicine

## 2023-07-03 ENCOUNTER — Ambulatory Visit (INDEPENDENT_AMBULATORY_CARE_PROVIDER_SITE_OTHER): Payer: BC Managed Care – PPO | Admitting: Family Medicine

## 2023-07-03 ENCOUNTER — Encounter: Payer: Self-pay | Admitting: Family Medicine

## 2023-07-03 VITALS — BP 122/88 | HR 70 | Resp 16 | Ht 69.0 in | Wt 227.6 lb

## 2023-07-03 DIAGNOSIS — R5383 Other fatigue: Secondary | ICD-10-CM | POA: Diagnosis not present

## 2023-07-03 DIAGNOSIS — Z1159 Encounter for screening for other viral diseases: Secondary | ICD-10-CM | POA: Diagnosis not present

## 2023-07-03 DIAGNOSIS — R7309 Other abnormal glucose: Secondary | ICD-10-CM | POA: Diagnosis not present

## 2023-07-03 DIAGNOSIS — Z136 Encounter for screening for cardiovascular disorders: Secondary | ICD-10-CM | POA: Diagnosis not present

## 2023-07-03 DIAGNOSIS — M1A0710 Idiopathic chronic gout, right ankle and foot, without tophus (tophi): Secondary | ICD-10-CM | POA: Diagnosis not present

## 2023-07-03 DIAGNOSIS — Z23 Encounter for immunization: Secondary | ICD-10-CM

## 2023-07-03 DIAGNOSIS — E559 Vitamin D deficiency, unspecified: Secondary | ICD-10-CM

## 2023-07-03 DIAGNOSIS — Z0000 Encounter for general adult medical examination without abnormal findings: Secondary | ICD-10-CM

## 2023-07-03 HISTORY — DX: Encounter for general adult medical examination without abnormal findings: Z00.00

## 2023-07-03 NOTE — Progress Notes (Signed)
Annual Physical Exam Visit  Patient Information:  Patient ID: Jorge Nicholson, male DOB: September 11, 1990 Age: 33 y.o. MRN: [id number]   Subjective:   CC: Annual Physical Exam  HPI:  Jorge Nicholson is here for their annual physical.  I reviewed the past medical history, family history, social history, surgical history, and allergies today and changes were made as necessary.  Please see the problem list section below for additional details.  Past Medical History: Past Medical History:  Diagnosis Date   Allergy    Gout    well controlled with lifestyle modifications (EtOH)   Healthcare maintenance 07/03/2023   Past Surgical History: Past Surgical History:  Procedure Laterality Date   LAPAROSCOPIC APPENDECTOMY  07/08/2011   Procedure: APPENDECTOMY LAPAROSCOPIC;  Surgeon: Kandis Cocking, MD;  Location: [facility];  Service: General;  Laterality: N/A;   Family History: Family History  Problem Relation Age of Onset   Hypertension Mother 64 - 42   Hypertension Father 80 - 79   Pancreatic cancer Maternal Grandmother 9 - 69   Alzheimer's disease Paternal Grandmother    Allergies: No Known Allergies Health Maintenance: Health Maintenance  Topic Date Due   HIV Screening  Never done   Hepatitis C Screening  Never done   INFLUENZA VACCINE  Never done   DTaP/Tdap/Td (2 - Td or Tdap) 07/02/2033   HPV VACCINES  Aged Out   COVID-19 Vaccine  Discontinued    HM Colonoscopy   This patient has no relevant Health Maintenance data.    Medications: No current outpatient medications on file prior to visit.   No current facility-administered medications on file prior to visit.    Objective:   Vitals:   07/03/23 1517  BP: 122/88  Pulse: 70  Resp: 16  SpO2: 98%   Vitals:   07/03/23 1517  Weight: 227 lb 9.6 oz (103.2 kg)  Height: 5\' 9"  (1.753 m)   Body mass index is 33.61 kg/m.  General: Well Developed, well nourished, and in no acute distress.  Neuro: Alert and oriented  x3, extra-ocular muscles intact, sensation grossly intact. Cranial nerves II through XII are grossly intact, motor, sensory, and coordinative functions are intact. HEENT: Normocephalic, atraumatic, neck supple, no masses, no lymphadenopathy, thyroid nonenlarged. Oropharynx, nasopharynx, external ear canals are unremarkable. Skin: Warm and dry, no rashes noted.  Cardiac: Regular rate and rhythm, no murmurs rubs or gallops. No peripheral edema. Pulses symmetric. Respiratory: Clear to auscultation bilaterally. Speaking in full sentences.  Abdominal: Soft, nontender, nondistended, positive bowel sounds, no masses, no organomegaly. Musculoskeletal: Stable, and with full range of motion.   Impression and Recommendations:   The patient was counselled, risk factors were discussed, and anticipatory guidance given.  Problem List Items Addressed This Visit     Gout   His right toe gout has been stable since the last visit in August of the previous year. No active symptoms are reported.  - Uric acid level obtained      Relevant Orders   Uric acid   Healthcare maintenance - Primary   Annual examination completed, risk stratification labs ordered, anticipatory guidance provided.  We will follow labs once resulted.      Relevant Orders   CBC   Other fatigue   He has been experiencing fatigue for several months, initially attributed to having a young child. His child, now a year and a half old, still sleeps in the same bed, but sleep quality has improved as he tries to  get the child to bed earlier. He previously reported getting about six hours of sleep per night and has been told about snoring, though he does not sleep during the day.  Fatigue Improved with better sleep and regular exercise. No daytime sleepiness or snoring reported. Young child still sleeping in the same bed. -Order labs to rule out organic causes of fatigue      Relevant Orders   TSH   VITAMIN D 25 Hydroxy (Vit-D  Deficiency, Fractures)   Other Visit Diagnoses       Screening for cardiovascular condition       Relevant Orders   Lipid panel     Vitamin D deficiency       Relevant Orders   VITAMIN D 25 Hydroxy (Vit-D Deficiency, Fractures)     Screening for viral disease       Relevant Orders   Hepatitis C antibody   HIV Antibody (routine testing w rflx)     Abnormal glucose       Relevant Orders   Comprehensive metabolic panel   Hemoglobin A1c     Encounter for immunization       Relevant Orders   Tdap vaccine greater than or equal to 7yo IM (Completed)        Orders & Medications Medications: No orders of the defined types were placed in this encounter.  Orders Placed This Encounter  Procedures   Tdap vaccine greater than or equal to 7yo IM   CBC   Comprehensive metabolic panel   Hemoglobin A1c   Hepatitis C antibody   HIV Antibody (routine testing w rflx)   Lipid panel   TSH   VITAMIN D 25 Hydroxy (Vit-D Deficiency, Fractures)   Uric acid     Return in about 1 year (around 07/02/2024) for CPE.    Jerrol Banana, MD, [facility]   Primary Care Sports Medicine Primary Care and Sports Medicine at [facility]

## 2023-07-03 NOTE — Assessment & Plan Note (Signed)
His right toe gout has been stable since the last visit in August of the previous year. No active symptoms are reported.  - Uric acid level obtained

## 2023-07-03 NOTE — Patient Instructions (Signed)
 -  Obtain fasting labs with orders provided (can have water or black coffee but otherwise no food or drink x 8 hours before labs) - Review information provided - Attend eye doctor annually, dentist every 6 months, work towards or maintain 30 minutes of moderate intensity physical activity at least 5 days per week, and consume a balanced diet - Return in 1 year for physical - Contact us for any questions between now and then

## 2023-07-03 NOTE — Assessment & Plan Note (Signed)
 Annual examination completed, risk stratification labs ordered, anticipatory guidance provided.  We will follow labs once resulted.

## 2023-07-03 NOTE — Assessment & Plan Note (Signed)
He has been experiencing fatigue for several months, initially attributed to having a young child. His child, now a year and a half old, still sleeps in the same bed, but sleep quality has improved as he tries to get the child to bed earlier. He previously reported getting about six hours of sleep per night and has been told about snoring, though he does not sleep during the day.  Fatigue Improved with better sleep and regular exercise. No daytime sleepiness or snoring reported. Young child still sleeping in the same bed. -Order labs to rule out organic causes of fatigue

## 2023-07-04 ENCOUNTER — Other Ambulatory Visit: Payer: Self-pay | Admitting: Family Medicine

## 2023-07-04 ENCOUNTER — Encounter: Payer: Self-pay | Admitting: Family Medicine

## 2023-07-04 DIAGNOSIS — E559 Vitamin D deficiency, unspecified: Secondary | ICD-10-CM

## 2023-07-04 LAB — LIPID PANEL
Chol/HDL Ratio: 3.7 {ratio} (ref 0.0–5.0)
Cholesterol, Total: 186 mg/dL (ref 100–199)
HDL: 50 mg/dL (ref >39)
LDL Chol Calc (NIH): 112 mg/dL — ABNORMAL HIGH (ref 0–99)
Triglycerides: 136 mg/dL (ref 0–149)
VLDL Cholesterol Cal: 24 mg/dL (ref 5–40)

## 2023-07-04 LAB — CBC
Hematocrit: 45.2 % (ref 37.5–51.0)
Hemoglobin: 15 g/dL (ref 13.0–17.7)
MCH: 28.7 pg (ref 26.6–33.0)
MCHC: 33.2 g/dL (ref 31.5–35.7)
MCV: 87 fL (ref 79–97)
Platelets: 275 10*3/uL (ref 150–450)
RBC: 5.22 x10E6/uL (ref 4.14–5.80)
RDW: 13.3 % (ref 11.6–15.4)
WBC: 7.1 10*3/uL (ref 3.4–10.8)

## 2023-07-04 LAB — COMPREHENSIVE METABOLIC PANEL
ALT: 21 [IU]/L (ref 0–44)
AST: 22 [IU]/L (ref 0–40)
Albumin: 4.6 g/dL (ref 4.1–5.1)
Alkaline Phosphatase: 61 [IU]/L (ref 44–121)
BUN/Creatinine Ratio: 13 (ref 9–20)
BUN: 17 mg/dL (ref 6–20)
Bilirubin Total: 0.2 mg/dL (ref 0.0–1.2)
CO2: 26 mmol/L (ref 20–29)
Calcium: 9.7 mg/dL (ref 8.7–10.2)
Chloride: 98 mmol/L (ref 96–106)
Creatinine, Ser: 1.33 mg/dL — ABNORMAL HIGH (ref 0.76–1.27)
Globulin, Total: 3 g/dL (ref 1.5–4.5)
Glucose: 91 mg/dL (ref 70–99)
Potassium: 4.4 mmol/L (ref 3.5–5.2)
Sodium: 139 mmol/L (ref 134–144)
Total Protein: 7.6 g/dL (ref 6.0–8.5)
eGFR: 72 mL/min/{1.73_m2} (ref >59)

## 2023-07-04 LAB — HEMOGLOBIN A1C
Est. average glucose Bld gHb Est-mCnc: 123 mg/dL
Hgb A1c MFr Bld: 5.9 % — ABNORMAL HIGH (ref 4.8–5.6)

## 2023-07-04 LAB — TSH: TSH: 2.3 u[IU]/mL (ref 0.450–4.500)

## 2023-07-04 LAB — VITAMIN D 25 HYDROXY (VIT D DEFICIENCY, FRACTURES): Vit D, 25-Hydroxy: 13.3 ng/mL — ABNORMAL LOW (ref 30.0–100.0)

## 2023-07-04 LAB — URIC ACID: Uric Acid: 8.5 mg/dL — ABNORMAL HIGH (ref 3.8–8.4)

## 2023-07-04 LAB — HIV ANTIBODY (ROUTINE TESTING W REFLEX): HIV Screen 4th Generation wRfx: NONREACTIVE

## 2023-07-04 LAB — HEPATITIS C ANTIBODY: Hep C Virus Ab: NONREACTIVE

## 2023-07-04 MED ORDER — VITAMIN D (ERGOCALCIFEROL) 1.25 MG (50000 UNIT) PO CAPS: 50000.0000 [IU] | ORAL_CAPSULE | ORAL | 0 refills | Status: DC

## 2023-07-04 NOTE — Telephone Encounter (Signed)
 Pt response.  KP

## 2024-01-14 DIAGNOSIS — L03012 Cellulitis of left finger: Secondary | ICD-10-CM | POA: Diagnosis not present

## 2024-02-06 ENCOUNTER — Encounter: Payer: Self-pay | Admitting: Family Medicine

## 2024-02-07 ENCOUNTER — Other Ambulatory Visit: Payer: Self-pay

## 2024-02-07 DIAGNOSIS — Z3009 Encounter for other general counseling and advice on contraception: Secondary | ICD-10-CM

## 2024-06-19 ENCOUNTER — Encounter: Payer: Self-pay | Admitting: Urology

## 2024-06-19 ENCOUNTER — Ambulatory Visit: Admitting: Urology

## 2024-06-19 VITALS — BP 140/81 | HR 79 | Ht 69.0 in | Wt 215.0 lb

## 2024-06-19 DIAGNOSIS — Z3009 Encounter for other general counseling and advice on contraception: Secondary | ICD-10-CM

## 2024-06-19 NOTE — Progress Notes (Unsigned)
 "  06/19/2024 2:38 PM   Jorge Nicholson August 08, 1990 [id number]  Referring provider: Alvia Selinda PARAS, MD [address] [place],  [place] [address]  Chief Complaint  Patient presents with   VAS Consult    HPI: Jorge Nicholson is a 34 y.o. male who presents for vasectomy counseling.  Married with 2 children Denies prior history urologic problems including chronic scrotal pain, epididymitis or orchitis No previous history inguinal hernia or pelvic surgery No history of bleeding or clotting disorders   PMH: Past Medical History:  Diagnosis Date   Allergy    Gout    well controlled with lifestyle modifications (EtOH)   Healthcare maintenance 07/03/2023    Surgical History: Past Surgical History:  Procedure Laterality Date   LAPAROSCOPIC APPENDECTOMY  07/08/2011   Procedure: APPENDECTOMY LAPAROSCOPIC;  Surgeon: Alm VEAR Angle, MD;  Location: [facility];  Service: General;  Laterality: N/A;    Home Medications:  Allergies as of 06/19/2024   No Known Allergies      Medication List        Accurate as of June 19, 2024  2:38 PM. If you have any questions, ask your nurse or doctor.          STOP taking these medications    Vitamin D  (Ergocalciferol ) 1.25 MG (50000 UNIT) Caps capsule Commonly known as: DRISDOL         Allergies: Allergies[1]  Family History: Family History  Problem Relation Age of Onset   Hypertension Mother 46 - 72   Hypertension Father 75 - 60   Pancreatic cancer Maternal Grandmother 39 - 35   Alzheimer's disease Paternal Grandmother     Social History:  reports that he has never smoked. He has never been exposed to tobacco smoke. He has never used smokeless tobacco. He reports current alcohol use of about 2.0 standard drinks of alcohol per week. He reports that he does not currently use drugs after having used the following drugs: Marijuana. Frequency: 2.00 times per week.   Physical Exam: BP (!) 140/81   Pulse 79   Ht 5' 9  (1.753 m)   Wt 215 lb (97.5 kg)   BMI 31.75 kg/m   Constitutional:  Alert and oriented, No acute distress. HEENT: [place] AT Respiratory: Normal respiratory effort, no increased work of breathing. GI: Abdomen is soft, nontender, nondistended, no abdominal masses GU: Phallus without lesions, testes descended bilaterally without masses or tenderness, spermatic cord/epididymis palpably normal bilaterally.  Vasa palpable bilaterally Psychiatric: Normal mood and affect.   Assessment & Plan:    1.  Undesired fertility Desires to schedule vasectomy We had a long discussion about vasectomy. We specifically discussed the procedure, recovery and the risks, benefits and alternatives of vasectomy. I explained that the procedure entails removal of a segment of each vas deferens, each of which conducts sperm, and that the purpose of this procedure is to cause sterility (inability to produce children or cause pregnancy). Vasectomy is intended to be permanent and irreversible form of contraception. Options for fertility after vasectomy include vasectomy reversal, or sperm retrieval with in vitro fertilization. These options are not always successful, and they may be expensive. We discussed reversible forms of birth control such as condoms, IUD or diaphragms, as well as the option of freezing sperm in a sperm bank prior to the vasectomy procedure. We discussed the importance of avoiding strenuous exercise for four days after vasectomy, and the importance of refraining from any form of ejaculation for seven days after  vasectomy. I explained that vasectomy does not produce immediate sterility so another form of contraceptive must be used until sterility is assured by having semen checked for sperm. Thus, a post vasectomy semen analysis is necessary to confirm sterility. Rarely, vasectomy must be repeated. We discussed the approximately 1 in 2,000 risk of pregnancy after vasectomy for men who have post-vasectomy semen  analysis showing absent sperm or rare non-motile sperm. Typical side effects include a small amount of oozing blood, some discomfort and mild swelling in the area of incision.  Vasectomy does not affect sexual performance, function, please, sensation, interest, desire, satisfaction, penile erection, volume of semen or ejaculation. Other rare risks include allergy or adverse reaction to an anesthetic, testicular atrophy, hematoma, infection/abscess, prolonged tenderness of the vas deferens, pain, swelling, painful nodule or scar (called sperm granuloma) or epididymtis. We discussed chronic testicular pain syndrome. This has been reported to occur in as many as 1-2% of men and may be permanent. This can be treated with medication, small procedures or (rarely) surgery. He indicated he would call back if he desires a preprocedure anxiolytic and would need a driver if utilizing   Jorge [name], MD  [facility] [address] [place], [place] [address] [phone]     [1] No Known Allergies  "

## 2024-06-19 NOTE — Patient Instructions (Signed)
 Pre-Vasectomy Instructions  STOP all aspirin or blood thinners (Aspirin, Plavix, Coumadin, Warfarin, Motrin, Ibuprofen, Advil, Aleve, Naproxen, Naprosyn) for 7 days prior to the procedure.  If you have any questions about stopping these medications please contact your primary care physician or cardiologist.  Shave all hair from the upper scrotum on the day of the procedure.  This means just under the penis onto the scrotal sac.  The area shaved should measure about 2-3 inches around.  You may lather the scrotum with soap and water, and shave with a safety razor.  After shaving the area, thoroughly wash the penis and the scrotum, then shower or bathe to remove all the loose hairs.  If needed, wash the area again just before coming in for your Vasectomy.  It is recommended to have a light meal an hour or so prior to the procedure.  Bring a scrotal support (jock strap or suspensory, or tight jockey shorts or underwear).  Wear comfortable pants or shorts.  While the actual procedure usually takes about 45 minutes, you should be prepared to stay in the office for approximately one hour.  Bring someone with you to drive you home.  If you have any questions or concerns, please feel free to call the office at [phone].

## 2024-07-04 ENCOUNTER — Encounter: Payer: BC Managed Care – PPO | Admitting: Family Medicine

## 2024-07-24 ENCOUNTER — Encounter: Admitting: Urology
